# Patient Record
Sex: Female | Born: 1940 | Race: Black or African American | Hispanic: No | Marital: Married | Smoking: Former smoker
Health system: Southern US, Community
[De-identification: ages and names within clinical notes are randomized; demographics above are authoritative.]

## PROBLEM LIST (undated history)

## (undated) DIAGNOSIS — G589 Mononeuropathy, unspecified: Secondary | ICD-10-CM

## (undated) DIAGNOSIS — J302 Other seasonal allergic rhinitis: Secondary | ICD-10-CM

## (undated) DIAGNOSIS — I10 Essential (primary) hypertension: Secondary | ICD-10-CM

## (undated) DIAGNOSIS — E119 Type 2 diabetes mellitus without complications: Secondary | ICD-10-CM

## (undated) DIAGNOSIS — S149XXA Injury of unspecified nerves of neck, initial encounter: Secondary | ICD-10-CM

## (undated) HISTORY — DX: Type 2 diabetes mellitus without complications: E11.9

## (undated) HISTORY — DX: Essential (primary) hypertension: I10

## (undated) HISTORY — PX: COLONOSCOPY: SHX174

## (undated) HISTORY — PX: DILATION AND CURETTAGE OF UTERUS: SHX78

---

## 1998-05-14 ENCOUNTER — Other Ambulatory Visit: Admission: RE | Admit: 1998-05-14 | Discharge: 1998-05-14 | Payer: Self-pay | Admitting: Emergency Medicine

## 1998-09-09 ENCOUNTER — Emergency Department (HOSPITAL_COMMUNITY): Admission: EM | Admit: 1998-09-09 | Discharge: 1998-09-09 | Payer: Self-pay | Admitting: Internal Medicine

## 1998-09-09 ENCOUNTER — Encounter: Payer: Self-pay | Admitting: Internal Medicine

## 1999-03-17 ENCOUNTER — Encounter: Admission: RE | Admit: 1999-03-17 | Discharge: 1999-06-15 | Payer: Self-pay | Admitting: Emergency Medicine

## 1999-06-20 ENCOUNTER — Other Ambulatory Visit: Admission: RE | Admit: 1999-06-20 | Discharge: 1999-06-20 | Payer: Self-pay | Admitting: Emergency Medicine

## 2000-06-22 ENCOUNTER — Emergency Department (HOSPITAL_COMMUNITY): Admission: EM | Admit: 2000-06-22 | Discharge: 2000-06-22 | Payer: Self-pay | Admitting: Emergency Medicine

## 2000-07-13 ENCOUNTER — Encounter: Admission: RE | Admit: 2000-07-13 | Discharge: 2000-07-13 | Payer: Self-pay | Admitting: Family Medicine

## 2000-07-13 ENCOUNTER — Encounter: Payer: Self-pay | Admitting: Family Medicine

## 2000-09-20 ENCOUNTER — Ambulatory Visit (HOSPITAL_COMMUNITY): Admission: RE | Admit: 2000-09-20 | Discharge: 2000-09-20 | Payer: Self-pay | Admitting: Gastroenterology

## 2003-09-01 ENCOUNTER — Emergency Department (HOSPITAL_COMMUNITY): Admission: EM | Admit: 2003-09-01 | Discharge: 2003-09-01 | Payer: Self-pay | Admitting: *Deleted

## 2004-08-01 ENCOUNTER — Emergency Department (HOSPITAL_COMMUNITY): Admission: EM | Admit: 2004-08-01 | Discharge: 2004-08-01 | Payer: Self-pay | Admitting: Family Medicine

## 2004-10-12 ENCOUNTER — Emergency Department (HOSPITAL_COMMUNITY): Admission: EM | Admit: 2004-10-12 | Discharge: 2004-10-13 | Payer: Self-pay | Admitting: Emergency Medicine

## 2004-10-20 ENCOUNTER — Encounter: Admission: RE | Admit: 2004-10-20 | Discharge: 2004-10-20 | Payer: Self-pay | Admitting: Emergency Medicine

## 2007-09-30 ENCOUNTER — Other Ambulatory Visit: Admission: RE | Admit: 2007-09-30 | Discharge: 2007-09-30 | Payer: Self-pay | Admitting: Emergency Medicine

## 2011-01-23 NOTE — Op Note (Signed)
[place]. [facility]  Patient:    Barbara Rodriguez, Barbara Rodriguez                    MRN: [id number] Proc. Date: 09/20/00 Adm. Date:  [id number] Attending:  Louie Bun CC:         Joycelyn Rua, M.D.   Operative Report  PROCEDURE PERFORMED:  Colonoscopy.  ENDOSCOPIST:  Everardo All. Madilyn Fireman, M.D.  INDICATIONS FOR PROCEDURE: Heme positive stools in a 70 year old patient with no previous colon screening.  DESCRIPTION OF PROCEDURE:  The patient was placed in the left lateral decubitus position and placed on the pulse monitor with continuous low flow oxygen delivered by nasal cannula.  She was sedated with 70 mg IV Demerol and 7 mg of IV Versed.  The Olympus video colonoscope was inserted into the rectum and advanced to the cecum, confirmed by transillumination of McBurneys point and visualization of the ileocecal valve and appendiceal orifice.  The prep was adequate.  The cecum, ascending, transverse and descending colon all appeared normal with no masses, polyps, diverticula or other mucosal abnormalities.  In the sigmoid colon were seen a few scattered diverticula and no other abnormalities. The rectum appeared normal and retroflex view of the anus revealed no obvious internal hemorrhoids.  The colonoscope was then withdrawn and the patient returned to the recovery room in stable condition. The patient tolerated the procedure well and there were no immediate complications.  IMPRESSION:  Left-sided diverticulosis, otherwise normal colonoscopy. DD:  09/20/00 TD:  09/20/00 Job: [id number]

## 2011-02-16 ENCOUNTER — Other Ambulatory Visit: Payer: Self-pay | Admitting: Obstetrics and Gynecology

## 2011-02-16 ENCOUNTER — Ambulatory Visit (HOSPITAL_COMMUNITY)
Admission: AD | Admit: 2011-02-16 | Discharge: 2011-02-16 | Disposition: A | Discharge status: Home / Self Care | Payer: Medicare Other | Source: Ambulatory Visit | Attending: Obstetrics and Gynecology | Admitting: Obstetrics and Gynecology

## 2011-02-16 DIAGNOSIS — R9389 Abnormal findings on diagnostic imaging of other specified body structures: Secondary | ICD-10-CM | POA: Insufficient documentation

## 2011-02-16 DIAGNOSIS — N950 Postmenopausal bleeding: Secondary | ICD-10-CM | POA: Insufficient documentation

## 2011-02-16 LAB — BASIC METABOLIC PANEL
BUN: 10 mg/dL (ref 6–23)
CO2: 26 mEq/L (ref 19–32)
Chloride: 103 mEq/L (ref 96–112)
Creatinine, Ser: 0.85 mg/dL (ref 0.4–1.2)
GFR calc Af Amer: >60 mL/min (ref >60)
Potassium: 4 mEq/L (ref 3.5–5.1)

## 2011-02-16 LAB — CBC
HCT: 38 % (ref 36.0–46.0)
Hemoglobin: 12.2 g/dL (ref 12.0–15.0)
MCH: 25.6 pg — ABNORMAL LOW (ref 26.0–34.0)
MCHC: 32.1 g/dL (ref 30.0–36.0)
MCV: 79.8 fL (ref 78.0–100.0)
Platelets: 193 10*3/uL (ref 150–400)
RBC: 4.76 MIL/uL (ref 3.87–5.11)
RDW: 14.3 % (ref 11.5–15.5)
WBC: 4.8 10*3/uL (ref 4.0–10.5)

## 2011-02-19 NOTE — Op Note (Signed)
  NAMEMAYAN, DOLNEY             ACCOUNT NO.:  [id number]  MEDICAL RECORD NO.:  [id number]  LOCATION:  [facility]                          FACILITY:  [facility]  PHYSICIAN:  Patsy Baltimore, MD     DATE OF BIRTH:  1941/05/21  DATE OF PROCEDURE:  02/16/2011 DATE OF DISCHARGE:                              OPERATIVE REPORT   PREOPERATIVE DIAGNOSES:  Postmenopausal bleeding, thickened endometrial lining.  POSTOPERATIVE DIAGNOSIS:  Postmenopausal bleeding, thickened endometrial lining.  PROCEDURE PERFORMED:  Hysteroscopy and D and C.  SURGEON:  Patsy Baltimore, MD.  ANESTHESIA:  General.  FINDINGS:  Thin endometrium.  Specimen sent was endometrial curettings.  ESTIMATED BLOOD LOSS:  Minimal.  COMPLICATIONS:  None.  Ms. Jessic Standifer is a 71 year old para 1 who was seen for postmenopausal bleeding on an outpatient basis.  Her endometrial biopsy came back negative.  However, her ultrasound demonstrated an endometrial stripe measuring 7.7 mL with multiple tiny cystic areas noted within the endometrium with blood flow.  She also was noted to have uterine fibroids.  Informed consent was obtained from the patient for hysteroscopy and D and C to further investigate this thickened endometrial lining in her postmenopausal state.  The patient verbalized understanding of the risks, benefits of the procedure and on the day of surgery she was taken to the operating room with IV fluids running.  She was put under general anesthesia, her legs were lifted up to the dorsal lithotomy position.  She was prepped and draped in the usual sterile fashion.  Her bladder was drained with a red rubber catheter.  A time- out was called and we began the procedure.  A speculum was inserted into the vagina and a single-toothed tenaculum was used to grasp the anterior lip of the cervix.  The cervix was then dilated enough to accommodate the hysteroscope.  Initial inspection of the uterus revealed a thin- walled  endometrial cavity.  The distending medium used was 1.5% glycine. A circumferential inspection of the cavity was done and there was no evidence of uterine polyp for over a structural endometrial pathology. The instruments were then removed.  Scant amount of tissue was obtained with sharp curettage and sent off to Pathology.  The single-toothed tenaculum was then removed from the anterior lip of the cervix.  The puncture sites were hemostatic.  The speculum was removed.  The procedure was concluded.  The patient was awakened and taken to the PACU in stable condition.  She tolerated the procedure well.          ______________________________ Patsy Baltimore, MD     CO/MEDQ  D:  02/16/2011  T:  02/17/2011  Job:  [id number]  Electronically Signed by Patsy Baltimore MD on 02/19/2011 10:00:12 AM

## 2011-12-31 ENCOUNTER — Other Ambulatory Visit (HOSPITAL_COMMUNITY): Payer: Self-pay | Admitting: Family Medicine

## 2011-12-31 DIAGNOSIS — Z1231 Encounter for screening mammogram for malignant neoplasm of breast: Secondary | ICD-10-CM

## 2012-01-12 ENCOUNTER — Other Ambulatory Visit: Payer: Self-pay | Admitting: Gastroenterology

## 2012-01-26 ENCOUNTER — Ambulatory Visit (HOSPITAL_COMMUNITY)
Admission: RE | Admit: 2012-01-26 | Discharge: 2012-01-26 | Disposition: A | Discharge status: Home / Self Care | Payer: Medicare Other | Source: Ambulatory Visit | Attending: Family Medicine | Admitting: Family Medicine

## 2012-01-26 DIAGNOSIS — Z1231 Encounter for screening mammogram for malignant neoplasm of breast: Secondary | ICD-10-CM | POA: Insufficient documentation

## 2013-08-10 ENCOUNTER — Encounter: Payer: Medicare PPO | Attending: Family Medicine | Admitting: *Deleted

## 2013-08-10 ENCOUNTER — Encounter: Payer: Self-pay | Admitting: *Deleted

## 2013-08-10 VITALS — Ht 62.0 in | Wt 132.9 lb

## 2013-08-10 DIAGNOSIS — E119 Type 2 diabetes mellitus without complications: Secondary | ICD-10-CM | POA: Insufficient documentation

## 2013-08-10 DIAGNOSIS — Z713 Dietary counseling and surveillance: Secondary | ICD-10-CM | POA: Insufficient documentation

## 2013-08-10 NOTE — Progress Notes (Signed)
Appt start time: 1100 end time:  1230.  Assessment:  Patient was seen on  08/10/13 for individual diabetes education. She is caring for her 72 year old mother who is starting with dementia. Strong family history of diabetes, her goal is to control with food and exercise, limit medications as able. She states history of diabetes for about 30 years and has had Diabetes education at various centers in the [place] area over the years. She states she SMBG 2-3 times a day with reported range of 103-169 mg/dl currently. She enjoys walking for about 30 minutes twice daily.  Current HbA1c: 8.0%  Preferred Learning Style:   No preference indicated   Learning Readiness:   Ready  Change in progress  MEDICATIONS: see list, no diabetes medications at this time  DIETARY INTAKE:  24-hr recall:  B ( AM): regular oatmeal and banana or applesauce, coffee with Hazelnut flavored creamer  Snk ( AM): no  L ( PM): chicken salad on 1 slice bread, applesauce or [place] yogurt, water or fruit drink Snk ( PM): no D ( PM): beans often OR lean meat, in chili and some avocado,  water or fruit drink  Snk ( PM): peanut cluster Beverages: coffee,   Usual physical activity: she walks for 30 minutes twice every day after breakfast and before supper  Estimated energy needs: 1400 calories 158 g carbohydrates 105 g protein 39 g fat    Intervention:  Nutrition counseling and or written info provided.  Discussed diabetes disease process and treatment options.  Discussed physiology of diabetes and role of obesity on insulin resistance.  Encouraged moderate weight reduction to improve glucose levels.  Discussed role of medications and diet in glucose control  Provided education on macronutrients on glucose levels.  Provided education on carb counting, importance of regularly scheduled meals/snacks, and meal planning  Discussed effects of physical activity on glucose levels and long-term glucose control.  Commended  her on her150 minutes of physical activity/week.  Discussed blood glucose monitoring and interpretation.  Discussed recommended target ranges and individual ranges.    Described short-term complications: hyper- and hypo-glycemia.  Discussed causes,symptoms, and treatment options. Provided information on:  Prevention, detection, and treatment of long-term complications.  Discussed the role of prolonged elevated glucose levels on body systems.  Role of stress on blood glucose levels and discussed strategies to manage psychosocial issues.  Recommendations for long-term diabetes self-care.  Established checklist for medical, dental, and emotional self-care.  Plan:  Aim for 3 Carb Choices per meal (45 grams) +/- 1 either way  Aim for 0-1 Carbs per snack if hungry  Consider reading food labels for Total Carbohydrate of foods Continue with your activity level tolerated Continue checking BG at alternate times per day as directed by MD  Teaching Method Utilized: all: Visual Auditory Hands on  Handouts given during visit include: Living Well with Diabetes Carb Counting and Food Label handouts Meal Plan Card  Barriers to learning/adherence to lifestyle change: caring for elderly parent  Diabetes self-care support plan:   [facility] support group available  Demonstrated degree of understanding via:  Teach Back   Monitoring/Evaluation:  Dietary intake, exercise, reading food labels, and body weight in 6 weeks.

## 2013-08-10 NOTE — Patient Instructions (Signed)
Plan:  Aim for 3 Carb Choices per meal (45 grams) +/- 1 either way  Aim for 0-1 Carbs per snack if hungry  Consider reading food labels for Total Carbohydrate of foods Continue with your activity level tolerated Continue checking BG at alternate times per day as directed by MD

## 2013-08-18 ENCOUNTER — Encounter: Payer: Self-pay | Admitting: *Deleted

## 2013-09-21 ENCOUNTER — Encounter: Payer: Self-pay | Admitting: *Deleted

## 2013-09-21 ENCOUNTER — Encounter: Payer: Medicare PPO | Attending: Family Medicine | Admitting: *Deleted

## 2013-09-21 VITALS — Ht 62.0 in | Wt 132.7 lb

## 2013-09-21 DIAGNOSIS — Z713 Dietary counseling and surveillance: Secondary | ICD-10-CM | POA: Insufficient documentation

## 2013-09-21 DIAGNOSIS — E119 Type 2 diabetes mellitus without complications: Secondary | ICD-10-CM

## 2013-09-21 NOTE — Progress Notes (Signed)
Appt start time: 1530 end time:  1600.  Assessment:  Patient was seen on  09/21/13 for individual diabetes education follow up visit. Weight is stable since last visit @ 132.7 lb.She continues to care for her 73 year old mother who is starting with dementia. She states she has had a sinus infection since last visit and has not been able to exercise normally, but plans to resume as soon as possible.  Current HbA1c: 8.0%  Preferred Learning Style:   No preference indicated   Learning Readiness:   Ready  Change in progress  MEDICATIONS: see list, no diabetes medications at this time  DIETARY INTAKE:  24-hr recall:  B ( AM): regular oatmeal and banana or applesauce, coffee with Hazelnut flavored creamer  Snk ( AM): no  L ( PM): chicken salad on 1 slice bread, applesauce or [place] yogurt, water or fruit drink Snk ( PM): no D ( PM): beans often OR lean meat, in chili and some avocado,  water or fruit drink  Snk ( PM): peanut cluster Beverages: coffee,   Usual physical activity: she walks for 30 minutes twice every day after breakfast and before supper  Estimated energy needs: 1400 calories 158 g carbohydrates 105 g protein 39 g fat     Intervention:  Commended her on her continued good eating habits and reviewed her Log Sheets she brought to the appointment which indicate FBG range of 118 - 170 mg/dl and other meals running better at 101-125 mg/dl. Reviewed the reading of food labels today.  Plan:  Aim for 3 Carb Choices per meal (45 grams) +/- 1 either way  Aim for 0-1 Carbs per snack if hungry  Consider reading food labels for Total Carbohydrate of foods Continue with your activity level tolerated Continue checking BG at alternate times per day as directed by MD  Teaching Method Utilized: all: Visual Auditory Hands on  Handouts given during visit include: Carb Counting and Food Label handouts  Barriers to learning/adherence to lifestyle change: caring for elderly  parent  Diabetes self-care support plan:   [facility] support group available  Demonstrated degree of understanding via:  Teach Back   Monitoring/Evaluation:  Dietary intake, exercise, reading food labels, and body weight in 2 months.

## 2013-11-09 ENCOUNTER — Ambulatory Visit: Payer: 59 | Admitting: *Deleted

## 2014-01-11 ENCOUNTER — Encounter: Payer: Self-pay | Admitting: *Deleted

## 2014-01-11 ENCOUNTER — Encounter: Payer: Medicare PPO | Attending: Family Medicine | Admitting: *Deleted

## 2014-01-11 VITALS — Ht 62.0 in | Wt 134.5 lb

## 2014-01-11 DIAGNOSIS — E119 Type 2 diabetes mellitus without complications: Secondary | ICD-10-CM | POA: Insufficient documentation

## 2014-01-11 DIAGNOSIS — Z713 Dietary counseling and surveillance: Secondary | ICD-10-CM | POA: Insufficient documentation

## 2014-01-11 NOTE — Progress Notes (Signed)
Appt start time: 1530 end time:  1600.  Assessment:  Patient was seen on  01/11/14 for individual diabetes education follow up visit. She brought her log books, she is averaging 150 mg/dl in AM and under 578130 mg/dl before lunch. She is still not on any diabetes medications yet. Weight is relatively stable since last visit @ 134.5 lb.She continues to care for her 73 year old mother who is starting with dementia. She continues to walk 30 minutes after breakfast and after supper inside her house or [facility]  She states she has had a sinus infection since last visit and has not been able to exercise normally, but plans to resume as soon as possible.  Current HbA1c: 8.0%  Preferred Learning Style:   No preference indicated   Learning Readiness:   Ready  Change in progress  MEDICATIONS: see list, no diabetes medications at this time  DIETARY INTAKE:  24-hr recall:  B ( AM): regular oatmeal and banana or applesauce, coffee with Hazelnut flavored creamer  Snk ( AM): no  L ( PM): chicken salad on 1 slice bread, applesauce or [place] yogurt, water or fruit drink Snk ( PM): no D ( PM): beans often OR lean meat, in chili and some avocado,  water or fruit drink  Snk ( PM): peanut cluster Beverages: coffee,   Usual physical activity: she walks for 30 minutes twice every day after breakfast and before supper  Estimated energy needs: 1400 calories 158 g carbohydrates 105 g protein 39 g fat     Intervention:  Commended her on her continued good eating habits and reviewed her Log Sheets she brought to the appointment which indicate FBG range of 118 - 170 mg/dl and other meals running better at 101-125 mg/dl. Reviewed the reading of food labels today.  Plan:  Aim for 3 Carb Choices per meal (45 grams) +/- 1 either way  Aim for 0-1 Carbs per snack if hungry  Continue reading food labels for Total Carbohydrate of foods Continue with your activity level walking around your house as  tolerated Continue checking BG at alternate times per day as directed by MD  You're doing a great job!   Teaching Method Utilized: all: Visual Auditory Hands on  Handouts given during visit include: Carb Counting and Food Label handouts  Barriers to learning/adherence to lifestyle change: caring for elderly parent  Diabetes self-care support plan:   [facility] support group available  Demonstrated degree of understanding via:  Teach Back   Monitoring/Evaluation:  Dietary intake, exercise, reading food labels, and body weight in 2 months.

## 2014-01-11 NOTE — Patient Instructions (Signed)
Plan:  Aim for 3 Carb Choices per meal (45 grams) +/- 1 either way  Aim for 0-1 Carbs per snack if hungry  Continue reading food labels for Total Carbohydrate of foods Continue with your activity level walking around your house as tolerated Continue checking BG at alternate times per day as directed by MD  You're doing a great job!

## 2014-03-13 ENCOUNTER — Encounter: Payer: Medicare PPO | Attending: Family Medicine | Admitting: *Deleted

## 2014-03-13 ENCOUNTER — Encounter: Payer: Self-pay | Admitting: *Deleted

## 2014-03-13 DIAGNOSIS — E119 Type 2 diabetes mellitus without complications: Secondary | ICD-10-CM | POA: Diagnosis not present

## 2014-03-13 DIAGNOSIS — Z713 Dietary counseling and surveillance: Secondary | ICD-10-CM | POA: Diagnosis not present

## 2014-03-13 NOTE — Progress Notes (Signed)
Appt start time: 1530 end time:  1600.  Assessment:  Patient was seen on  03/13/14 for individual diabetes education follow up visit. She brought her log books again, she is now averaging 130 mg/dl in AM and under 161130 mg/dl before lunch. She states she has a problem limiting her chips in the evenings and has not been able to get to [employer] where she prefers to walk each day due to having to care for her Mother, who is now in Hospice Care at home. Weight is still stable since last visit @ 134.5 lb.S  Current HbA1c: 8.0%  Preferred Learning Style:   No preference indicated   Learning Readiness:   Ready  Change in progress  MEDICATIONS: see list, no diabetes medications at this time  DIETARY INTAKE:  24-hr recall:  B ( AM): regular oatmeal and banana or applesauce, coffee with Hazelnut flavored creamer  Snk ( AM): no  L ( PM): chicken salad on 1 slice bread, applesauce or [place] yogurt, water or fruit drink Snk ( PM): no D ( PM): beans often OR lean meat, in chili and some avocado,  water or fruit drink  Snk ( PM): peanut cluster Beverages: coffee,   Usual physical activity: she walks for 30 minutes twice every day after breakfast and before supper  Estimated energy needs: 1400 calories 158 g carbohydrates 105 g protein 39 g fat     Intervention:  Reviewed her Log Sheets she brought to the appointment which indicate FBG range of 118 - 170 mg/dl and other meals running better at 101-125 mg/dl. Suggested the use of Arm Chair Exercises as back up plan for walking when she can't leave her home. Also suggested a 2 hour post meal BG occasionally to capture how high her BGs can go, which will affect her A1c too.   Plan:  Continue to aim for 3 Carb Choices per meal (45 grams) +/- 1 either way  Continue to aim for 0-1 Carbs per snack if hungry  Continue reading food labels for Total Carbohydrate of foods Continue with your activity level walking around your house as tolerated Consider  the Arm Chair Exercises at home when you [facility]'t walk [facility] Continue checking BG at alternate times per day, including 2 hours after a meal occasionally  You're doing a great job!   Teaching Method Utilized: Auditory  Handouts given during visit include: Arm Chair Exercise Handout and suggestion for videos on computer  Barriers to learning/adherence to lifestyle change: caring for elderly parent  Diabetes self-care support plan:   [facility] support group available  Demonstrated degree of understanding via:  Teach Back   Monitoring/Evaluation:  Dietary intake, exercise, reading food labels, and body weight in 2 months.

## 2014-03-13 NOTE — Patient Instructions (Addendum)
Plan:  Continue to aim for 3 Carb Choices per meal (45 grams) +/- 1 either way  Continue to aim for 0-1 Carbs per snack if hungry  Continue reading food labels for Total Carbohydrate of foods Continue with your activity level walking around your house as tolerated Consider the Arm Chair Exercises at home when [facility] Continue checking BG at alternate times per day, including 2 hours after a meal occasionally  You're doing a great job!

## 2014-05-16 ENCOUNTER — Encounter: Payer: Self-pay | Admitting: *Deleted

## 2014-05-16 ENCOUNTER — Encounter: Payer: Medicare PPO | Attending: Family Medicine | Admitting: *Deleted

## 2014-05-16 VITALS — Ht 62.0 in | Wt 132.5 lb

## 2014-05-16 DIAGNOSIS — E119 Type 2 diabetes mellitus without complications: Secondary | ICD-10-CM | POA: Diagnosis not present

## 2014-05-16 DIAGNOSIS — Z713 Dietary counseling and surveillance: Secondary | ICD-10-CM | POA: Insufficient documentation

## 2014-05-16 NOTE — Progress Notes (Signed)
Appt start time: 1530 end time:  1600.  Assessment:  Patient was seen on 05/16/14 for individual diabetes education follow up visit. She brought her log books again, she continues averaging 150 mg/dl in AM and under 161 mg/dl before lunch. She states she had some company over Labor Day and ate some extra foods while they were here. So she has gotten back on track with her food and is happy with weight loss of 2 pounds this past month. She is also turning off her air conditioner with the cooler weather and sometimes she breaks out in a sweat when the weather warms up during the day. She continues walking in the evenings and has been able to get to [employer] where she prefers to walk each day due to having to care for her Mother, who is now in Hospice Care at home.   Current HbA1c: 8.0%, next appointment for A1c is in December  Preferred Learning Style:   No preference indicated   Learning Readiness:   Ready  Change in progress  MEDICATIONS: see list, no diabetes medications at this time  DIETARY INTAKE:  24-hr recall:  B ( AM): regular oatmeal and banana or applesauce, coffee with Hazelnut flavored creamer  Snk ( AM): no  L ( PM): chicken salad on 1 slice bread, applesauce or [place] yogurt, water or fruit drink Snk ( PM): no D ( PM): beans often OR lean meat, in chili and some avocado,  water or fruit drink  Snk ( PM): peanut cluster or a handful of chips Beverages: coffee,   Usual physical activity: she walks for 30 minutes twice every day after breakfast and before supper  Estimated energy needs: 1400 calories 158 g carbohydrates 105 g protein 39 g fat     Intervention:  Reviewed her Log Sheets she brought to the appointment which indicate FBG range of 118 - 160 mg/dl and other meals running better at 101-125 mg/dl. Reminded her of the use of Arm Chair Exercises as back up plan for walking when she can't leave her home. Commended her on her weight loss of 2 pounds this visit.    Plan:  Continue to aim for 3 Carb Choices per meal (45 grams) +/- 1 either way  Continue to aim for 0-1 Carbs per snack if hungry  Continue reading food labels for Total Carbohydrate of foods Continue with your activity level walking around your house as tolerated Consider the Arm Chair Exercises at home when you can't walk at [facility] Continue checking BG at alternate times per day, including 2 hours after a meal occasionally  You're doing a great job!   Teaching Method Utilized: Auditory  Handouts given during visit include: No handouts this visit. Gave her a blue star to acknowledge the progress she has made  Barriers to learning/adherence to lifestyle change: caring for elderly parent  Diabetes self-care support plan:   [facility] support group available  Demonstrated degree of understanding via:  Teach Back   Monitoring/Evaluation:  Dietary intake, exercise, reading food labels, and body weight in 2 months.

## 2014-05-16 NOTE — Patient Instructions (Signed)
Plan:  Continue to aim for 3 Carb Choices per meal (45 grams) +/- 1 either way  Continue to aim for 0-1 Carbs per snack if hungry  Continue reading food labels for Total Carbohydrate of foods Continue with your activity level walking around your house as tolerated Consider the Arm Chair Exercises at home when you can't walk at [employer] Continue checking BG at alternate times per day, including 2 hours after a meal occasionally  You're doing a great job!    

## 2014-07-26 ENCOUNTER — Ambulatory Visit: Payer: 59 | Admitting: *Deleted

## 2014-09-06 ENCOUNTER — Ambulatory Visit: Payer: 59 | Admitting: *Deleted

## 2014-09-21 ENCOUNTER — Encounter: Payer: Medicare PPO | Attending: Family Medicine | Admitting: *Deleted

## 2014-09-21 VITALS — Ht 62.0 in | Wt 128.3 lb

## 2014-09-21 DIAGNOSIS — E119 Type 2 diabetes mellitus without complications: Secondary | ICD-10-CM | POA: Insufficient documentation

## 2014-09-21 NOTE — Progress Notes (Signed)
Appt start time: 1100 end time:  1130.  Assessment:  Patient was seen on 09/21/14 for individual diabetes education follow up visit. She is happy with weight loss of 4 pounds since last visit in September. She states she had to miss a couple of appointments due to sinus problems. She does not have her log sheets today, but she states her BG continues to be within the target ranges pre and post meal unless she is sick, they will go higher. She continues to care for her mother daily, so that stress is still there.   Current HbA1c: 7.6% in December  Preferred Learning Style:   No preference indicated   Learning Readiness:   Ready  Change in progress  MEDICATIONS: see list, no diabetes medications at this time  DIETARY INTAKE:  24-hr recall:  B ( AM): regular oatmeal and banana or applesauce, coffee with Hazelnut flavored creamer  Snk ( AM): no  L ( PM): chicken salad on 1 slice bread, applesauce or [place] yogurt, water or fruit drink Snk ( PM): no D ( PM): beans often OR lean meat, in chili and some avocado,  water or fruit drink  Snk ( PM): peanut cluster or a handful of chips Beverages: coffee,   Usual physical activity: she walks for 30 minutes twice every day after breakfast and before supper  Estimated energy needs: 1400 calories 158 g carbohydrates 105 g protein 39 g fat     Intervention:  Commended her on her continued good eating habits and exercise, resulting in her current weight loss. I suggested she maintain a weight between 125 and 130 pounds for good health. Explained that continued weight loss or excessive exercise may not be adequate as time goes on to control her BG, and that she may need diabetes medication eventually through no fault of her own.    Plan:  Continue to aim for 3 Carb Choices per meal (45 grams) +/- 1 either way  Continue to aim for 0-1 Carbs per snack if hungry  Continue reading food labels for Total Carbohydrate of foods Continue with your  activity level walking around your house as tolerated Consider the Arm Chair Exercises at home when [facility] Continue checking BG at alternate times per day, including 2 hours after a meal occasionally  You're doing a great job!   Teaching Method Utilized: Auditory  Handouts given during visit include: No handouts this visit. Gave her a red star to acknowledge the progress she has made  Barriers to learning/adherence to lifestyle change: caring for elderly parent  Diabetes self-care support plan:   [facility] support group available  Demonstrated degree of understanding via:  Teach Back   Monitoring/Evaluation:  Dietary intake, exercise, reading food labels, and body weight in 1 month.

## 2014-09-21 NOTE — Patient Instructions (Signed)
Plan:  Continue to aim for 3 Carb Choices per meal (45 grams) +/- 1 either way  Continue to aim for 0-1 Carbs per snack if hungry  Continue reading food labels for Total Carbohydrate of foods Continue with your activity level walking around your house as tolerated Consider the Arm Chair Exercises at home when you can't walk at [employer] Continue checking BG at alternate times per day, including 2 hours after a meal occasionally  You're doing a great job!    

## 2014-09-25 DIAGNOSIS — H04123 Dry eye syndrome of bilateral lacrimal glands: Secondary | ICD-10-CM | POA: Diagnosis not present

## 2014-09-25 DIAGNOSIS — H1859 Other hereditary corneal dystrophies: Secondary | ICD-10-CM | POA: Diagnosis not present

## 2014-09-25 DIAGNOSIS — H40013 Open angle with borderline findings, low risk, bilateral: Secondary | ICD-10-CM | POA: Diagnosis not present

## 2014-09-25 DIAGNOSIS — H2513 Age-related nuclear cataract, bilateral: Secondary | ICD-10-CM | POA: Diagnosis not present

## 2014-10-01 DIAGNOSIS — H40013 Open angle with borderline findings, low risk, bilateral: Secondary | ICD-10-CM | POA: Diagnosis not present

## 2014-10-26 ENCOUNTER — Ambulatory Visit: Payer: 59 | Admitting: *Deleted

## 2014-11-01 ENCOUNTER — Encounter: Payer: Medicare PPO | Attending: Family Medicine | Admitting: *Deleted

## 2014-11-01 ENCOUNTER — Encounter: Payer: Self-pay | Admitting: *Deleted

## 2014-11-01 VITALS — Ht 62.0 in | Wt 128.8 lb

## 2014-11-01 DIAGNOSIS — E119 Type 2 diabetes mellitus without complications: Secondary | ICD-10-CM

## 2014-11-01 NOTE — Patient Instructions (Signed)
Plan:  Continue to aim for 3 Carb Choices per meal (45 grams) +/- 1 either way  Continue to aim for 0-1 Carbs per snack if hungry  Continue reading food labels for Total Carbohydrate of foods Continue with your activity level walking around your house as tolerated Consider the Arm Chair Exercises at home when you can't walk at [employer] Continue checking BG at alternate times per day, including 2 hours after a meal occasionally  You're doing a great job!    

## 2014-11-01 NOTE — Progress Notes (Signed)
Appt start time: 1000 end time:  1030.  Assessment:  Patient was seen on 09/21/14 for individual diabetes education follow up visit. Weight is stable @ 128.8 pounds since last visit. She brought her log sheets today,her BG continues to be within the target ranges pre and post meal. She continues to care for her mother daily, but she states [place] so she can get out to run errands and go to church which is very helpful.   Current HbA1c: 7.6% in December  Preferred Learning Style:   No preference indicated   Learning Readiness:   Ready  Change in progress  MEDICATIONS: see list, no diabetes medications at this time  DIETARY INTAKE:  24-hr recall:  B ( AM): regular oatmeal and banana or applesauce, coffee with Hazelnut flavored creamer  Snk ( AM): no  L ( PM): chicken salad on 1 slice bread, applesauce or [place] yogurt, water or fruit drink Snk ( PM): no D ( PM): beans often OR lean meat, in chili and some avocado,  water or fruit drink  Snk ( PM): peanut cluster or a handful of chips Beverages: coffee,   Usual physical activity: she walks for 30 minutes twice every day after breakfast and before supper  Estimated energy needs: 1400 calories 158 g carbohydrates 105 g protein 39 g fat     Intervention:  Commended her on her continued good eating habits and exercise, resulting in good BG control. I reminded her that she should maintain a weight between 125 and 130 pounds for good health.     Plan:  Continue to aim for 3 Carb Choices per meal (45 grams) +/- 1 either way  Continue to aim for 0-1 Carbs per snack if hungry  Continue reading food labels for Total Carbohydrate of foods Continue with your activity level walking around your house as tolerated Consider the Arm Chair Exercises at home when you can't walk [facility] Continue checking BG at alternate times per day, including 2 hours after a meal occasionally  You're doing a great job!    Teaching Method Utilized: Auditory  Handouts given during visit include: No handouts this visit. Gave her a red star to acknowledge the progress she has made  Barriers to learning/adherence to lifestyle change: caring for elderly parent  Diabetes self-care support plan:   [facility]  Demonstrated degree of understanding via:  Teach Back   Monitoring/Evaluation:  Dietary intake, exercise, reading food labels, and body weight in 1 month.

## 2014-12-06 ENCOUNTER — Ambulatory Visit: Payer: BC Managed Care – PPO | Admitting: *Deleted

## 2014-12-14 ENCOUNTER — Ambulatory Visit: Payer: BC Managed Care – PPO | Admitting: *Deleted

## 2015-01-03 ENCOUNTER — Ambulatory Visit: Payer: BC Managed Care – PPO | Admitting: *Deleted

## 2015-01-18 ENCOUNTER — Encounter: Payer: Medicare PPO | Attending: Family Medicine | Admitting: *Deleted

## 2015-01-18 ENCOUNTER — Encounter: Payer: Self-pay | Admitting: *Deleted

## 2015-01-18 VITALS — Ht 62.0 in | Wt 132.8 lb

## 2015-01-18 DIAGNOSIS — E119 Type 2 diabetes mellitus without complications: Secondary | ICD-10-CM | POA: Insufficient documentation

## 2015-01-18 NOTE — Progress Notes (Signed)
Appt start time: 1000 end time:  1030.  Assessment:  Patient was seen on 01/1315 for individual diabetes education follow up visit. She states she has been sick with her allergies sometimes with fever so less activity lately. Still caring for her mother who she states is doing better lately. They celebrated her Mothers's 97th birthday with her 74 yo sister coming to celebrate with them. She continues to check her BG each AM with range 120-160 with average below 140 mg/dl. She see's her MD every 6 months so the next A1c won't be until June, 2016.   Current HbA1c: 7.6% in December  Preferred Learning Style:   No preference indicated   Learning Readiness:   Ready  Change in progress  MEDICATIONS: see list, no diabetes medications at this time  DIETARY INTAKE:  24-hr recall:  B ( AM): regular oatmeal and banana or applesauce, coffee with Hazelnut flavored creamer  Snk ( AM): no  L ( PM): chicken salad on 1 slice bread, applesauce or [place] yogurt, water or fruit drink Snk ( PM): no D ( PM): beans often OR lean meat, in chili and some avocado,  water or fruit drink  Snk ( PM): peanut cluster or a handful of chips Beverages: coffee,   Usual physical activity: she walks for 30 minutes twice every day after breakfast and before supper  Estimated energy needs: 1200 calories 135 g carbohydrates 90 g protein 33 g fat     Intervention:  Commended her again  on her continued good eating habits and exercise, resulting in good BG control. Per actual food intake she is averaging closer to 1200 calories per day and only 2 Carb Choices per meal, so meal plan adjusted. We have discussed that she should maintain a weight between 125 and 130 pounds for good health.     Plan:  Continue to aim for 2 Carb Choices per meal (30 grams) +/- 1 either way  Continue to aim for 0-1 Carbs per snack if hungry  Continue reading food labels for Total Carbohydrate of foods Continue with your activity level  walking around your house as tolerated Continue the Arm Chair Exercises at home when you can't walk [facility] Continue checking BG at alternate times per day, including 2 hours after a meal occasionally  You're doing a great job!   Teaching Method Utilized: Auditory  Handouts given during visit include: Carb Counting handout with examples of carb content of simple desserts. Gave her a green star to acknowledge the progress she has made  Barriers to learning/adherence to lifestyle change: caring for elderly parent  Diabetes self-care support plan:   [facility] support [facility] available  Demonstrated degree of understanding via:  Teach Back   Monitoring/Evaluation:  Dietary intake, exercise, reading food labels, and body weight in 1 month.

## 2015-01-18 NOTE — Patient Instructions (Signed)
Plan:  Continue to aim for 2 Carb Choices per meal (30 grams) +/- 1 either way  Continue to aim for 0-1 Carbs per snack if hungry  Continue reading food labels for Total Carbohydrate of foods Continue with your activity level walking around your house as tolerated Continue the Arm Chair Exercises at home when you can't walk at [employer] Continue checking BG at alternate times per day, including 2 hours after a meal occasionally  You're doing a great job!    

## 2015-01-22 ENCOUNTER — Other Ambulatory Visit (HOSPITAL_COMMUNITY): Payer: Self-pay | Admitting: Family Medicine

## 2015-01-22 DIAGNOSIS — Z1231 Encounter for screening mammogram for malignant neoplasm of breast: Secondary | ICD-10-CM

## 2015-01-24 ENCOUNTER — Ambulatory Visit (HOSPITAL_COMMUNITY)
Admission: RE | Admit: 2015-01-24 | Discharge: 2015-01-24 | Disposition: A | Discharge status: Home / Self Care | Payer: Medicare PPO | Source: Ambulatory Visit | Attending: Family Medicine | Admitting: Family Medicine

## 2015-01-24 DIAGNOSIS — Z1231 Encounter for screening mammogram for malignant neoplasm of breast: Secondary | ICD-10-CM | POA: Insufficient documentation

## 2015-02-21 ENCOUNTER — Ambulatory Visit: Payer: BC Managed Care – PPO | Admitting: *Deleted

## 2015-02-26 DIAGNOSIS — E780 Pure hypercholesterolemia: Secondary | ICD-10-CM | POA: Diagnosis not present

## 2015-02-26 DIAGNOSIS — D692 Other nonthrombocytopenic purpura: Secondary | ICD-10-CM | POA: Diagnosis not present

## 2015-02-26 DIAGNOSIS — Z23 Encounter for immunization: Secondary | ICD-10-CM | POA: Diagnosis not present

## 2015-02-26 DIAGNOSIS — Z0000 Encounter for general adult medical examination without abnormal findings: Secondary | ICD-10-CM | POA: Diagnosis not present

## 2015-02-26 DIAGNOSIS — E559 Vitamin D deficiency, unspecified: Secondary | ICD-10-CM | POA: Diagnosis not present

## 2015-02-26 DIAGNOSIS — Z79899 Other long term (current) drug therapy: Secondary | ICD-10-CM | POA: Diagnosis not present

## 2015-02-26 DIAGNOSIS — N182 Chronic kidney disease, stage 2 (mild): Secondary | ICD-10-CM | POA: Diagnosis not present

## 2015-02-26 DIAGNOSIS — E1165 Type 2 diabetes mellitus with hyperglycemia: Secondary | ICD-10-CM | POA: Diagnosis not present

## 2015-02-26 DIAGNOSIS — E1121 Type 2 diabetes mellitus with diabetic nephropathy: Secondary | ICD-10-CM | POA: Diagnosis not present

## 2015-02-26 DIAGNOSIS — I10 Essential (primary) hypertension: Secondary | ICD-10-CM | POA: Diagnosis not present

## 2015-03-14 ENCOUNTER — Encounter: Payer: Medicare PPO | Attending: Family Medicine | Admitting: *Deleted

## 2015-03-14 VITALS — Ht 62.0 in | Wt 130.0 lb

## 2015-03-14 DIAGNOSIS — E119 Type 2 diabetes mellitus without complications: Secondary | ICD-10-CM | POA: Insufficient documentation

## 2015-03-14 NOTE — Patient Instructions (Signed)
Plan:  Continue to aim for 2 Carb Choices per meal (30 grams) +/- 1 either way  Continue to aim for 0-1 Carbs per snack if hungry  Continue reading food labels for Total Carbohydrate of foods Continue with your activity level walking around your house as tolerated Continue the Arm Chair Exercises at home when you can't walk at [facility] Continue checking BG at alternate times per day, including 2 hours after a meal occasionally  You're doing a great job!

## 2015-03-15 NOTE — Progress Notes (Signed)
Appt start time: 1130 end time:  1200.  Assessment:  Patient was seen on 03/14/1615 for individual diabetes education follow up visit. She is feeling better today, happy with stable weight at 130 pounds. She did not bring her BG Log sheet today but states her last A1c was 7.7%. She is struggling with a Vitamin D deficiency.  Current HbA1c: 7.7% in June  Preferred Learning Style:   No preference indicated   Learning Readiness:   Ready  Change in progress  MEDICATIONS: see list, no diabetes medications at this time  DIETARY INTAKE:  24-hr recall:  B ( AM): regular oatmeal and banana or applesauce, coffee with Hazelnut flavored creamer  Snk ( AM): no  L ( PM): chicken salad on 1 slice bread, applesauce or [place] yogurt, water or fruit drink Snk ( PM): no D ( PM): beans often OR lean meat, in chili and some avocado,  water or fruit drink  Snk ( PM): peanut cluster or a handful of chips Beverages: coffee,   Usual physical activity: she walks for 30 minutes twice every day after breakfast and before supper  Estimated energy needs: 1200 calories 135 g carbohydrates 90 g protein 33 g fat    Intervention:  Commended her again  on her continued good eating habits and exercise, resulting in good BG control. I provided her with more information on Vitamin D content of foods per her request. Also reviewed Carb Counting and provided the yellow Meal Plan Card again.  Plan:  Continue to aim for 2 Carb Choices per meal (30 grams) +/- 1 either way  Continue to aim for 0-1 Carbs per snack if hungry  Continue reading food labels for Total Carbohydrate of foods Continue with your activity level walking around your house as tolerated Continue the Arm Chair Exercises at home when you can't walk [facility] Continue checking BG at alternate times per day, including 2 hours after a meal occasionally  You're doing a great job!    Teaching Method Utilized: Auditory  Handouts given during visit  include: Vitamin D content of foods list Yellow Meal Plan Card  Barriers to learning/adherence to lifestyle change: caring for elderly parent  Diabetes self-care support plan:   [facility] support [facility] available  Demonstrated degree of understanding via:  Teach Back   Monitoring/Evaluation:  Dietary intake, exercise, reading food labels, and body weight in 1 month.

## 2015-03-26 DIAGNOSIS — E2839 Other primary ovarian failure: Secondary | ICD-10-CM | POA: Diagnosis not present

## 2015-04-02 DIAGNOSIS — H40053 Ocular hypertension, bilateral: Secondary | ICD-10-CM | POA: Diagnosis not present

## 2015-04-18 ENCOUNTER — Ambulatory Visit: Payer: Medicare Other | Admitting: *Deleted

## 2015-05-01 ENCOUNTER — Encounter: Payer: Self-pay | Admitting: *Deleted

## 2015-05-01 ENCOUNTER — Encounter: Payer: Medicare PPO | Attending: Family Medicine | Admitting: *Deleted

## 2015-05-01 VITALS — Ht 62.0 in | Wt 132.5 lb

## 2015-05-01 DIAGNOSIS — E119 Type 2 diabetes mellitus without complications: Secondary | ICD-10-CM | POA: Diagnosis not present

## 2015-05-01 NOTE — Patient Instructions (Addendum)
Plan:  Continue to aim for 2 Carb Choices per meal (30 grams) +/- 1 either way  Continue to aim for 0-1 Carbs per snack if hungry  Continue reading food labels for Total Carbohydrate of foods Continue with your activity level walking around your house as tolerated Continue the Arm Chair Exercises at home when you can't walk at [employer] Continue checking BG at alternate times per day, including 2 hours after a meal occasionally  You're doing a great job! Consider checking your BG after a meal occasionally to capture that BG information    

## 2015-05-01 NOTE — Progress Notes (Signed)
Appt start time: 1130 end time:  1200.  Assessment:  Patient was seen on 05/01/2015 for individual diabetes education follow up visit. Weight up 2 pounds today @ 132.5 lbs. She states she continues to check her BG daily with reported range of 123-140 mg/dl. She does have some higher calorie and carb containing food choices she eats in form of comfort such as a Frosty from [employer]. She states that frequency has increased lately.   Current HbA1c: 7.7% in June  Preferred Learning Style:   No preference indicated   Learning Readiness:   Ready  Change in progress  MEDICATIONS: see list, no diabetes medications at this time  DIETARY INTAKE:  24-hr recall:  B ( AM): regular oatmeal and banana or applesauce, coffee with Hazelnut flavored creamer  Snk ( AM): no  L ( PM): chicken salad on 1 slice bread, applesauce or [place] yogurt, water or fruit drink Snk ( PM): no D ( PM): beans often OR lean meat, in chili and some avocado,  water or fruit drink  Snk ( PM): peanut cluster or a handful of chips Beverages: coffee,   Usual physical activity: she walks for 30 minutes twice every day after breakfast and before supper. She typically walks at [facility] to be in a safe place.  Estimated energy needs: 1200 calories 135 g carbohydrates 90 g protein 33 g fat    Intervention:  She continues to care for her mother so has little time for herself. I cautioned her about the increase in her A1c and suggested that if that continues, the MD will recommend starting a Diabetes medication. Explained that if she is eating appropriately and needs the medication, that is one thing, but if she is consuming too much food, then she may want to consider the frequency of the sweets and modify to help her body control her BG better. Commended her again  on her efforts toward continued good eating habits most of the time and her exercise, resulting in good BG control.  Plan:  Continue to aim for 2  Carb Choices per meal (30 grams) +/- 1 either way  Continue to aim for 0-1 Carbs per snack if hungry  Continue reading food labels for Total Carbohydrate of foods Continue with your activity level walking around your house as tolerated Continue the Arm Chair Exercises at home when you can't walk at [facility] Consider checking your BG after a meal occasionally to capture that BG information Continue checking BG at alternate times per day, including 2 hours after a meal occasionally  You're doing a great job!   Teaching Method Utilized: Auditory  Handouts given during visit include: No new handouts today  Barriers to learning/adherence to lifestyle change: caring for elderly parent  Diabetes self-care support plan:   [facility] support group available  Demonstrated degree of understanding via:  Teach Back   Monitoring/Evaluation:  Dietary intake, exercise, reading food labels, and body weight in 1 month.

## 2015-05-27 DIAGNOSIS — E559 Vitamin D deficiency, unspecified: Secondary | ICD-10-CM | POA: Diagnosis not present

## 2015-06-06 ENCOUNTER — Encounter: Payer: Medicare PPO | Attending: Family Medicine | Admitting: *Deleted

## 2015-06-06 ENCOUNTER — Encounter: Payer: Self-pay | Admitting: *Deleted

## 2015-06-06 VITALS — Ht 62.0 in | Wt 132.1 lb

## 2015-06-06 DIAGNOSIS — E119 Type 2 diabetes mellitus without complications: Secondary | ICD-10-CM | POA: Diagnosis not present

## 2015-06-06 NOTE — Progress Notes (Signed)
Appt start time: 1130 end time:  1200.  Assessment:  Patient was seen on 06/06/2015 for individual diabetes education follow up visit. Weight down 0.5 pounds today @ 131.1 lbs. She did not bring her Log Sheet today but she states she continues to check her BG daily with reported results within target ranges pre and post meals.  She continues to care for her elderly mother. She states when she does eat something higher in carb she makes every effort to walk more after that meal to help offset the potential affect on her BG.  Current HbA1c: 7.7% in June 2016  Preferred Learning Style:   No preference indicated   Learning Readiness:   Ready  Change in progress  MEDICATIONS: see list, no diabetes medications at this time  DIETARY INTAKE:  24-hr recall:  B ( AM): regular oatmeal and banana or applesauce, coffee with Hazelnut flavored creamer  Snk ( AM): no  L ( PM): chicken salad on 1 slice bread, applesauce or [place] yogurt, water or fruit drink Snk ( PM): no D ( PM): beans often OR lean meat, in chili and some avocado,  water or fruit drink  Snk ( PM): peanut cluster or a handful of chips Beverages: coffee,   Usual physical activity: she walks for 30 minutes twice every day after breakfast and before supper. She typically walks at [facility] [employer] to be in a safe place.  Estimated energy needs: 1200 calories 135 g carbohydrates 90 g protein 33 g fat    Intervention:  She continues to care for her mother so has little time for herself. Encouraged her to continue with her activity level and especially post meal to help with hyperglycemia times that will impact her A1c.  Commended her again  on her efforts toward continued good eating habits most of the time and her exercise, resulting in good BG control.   Plan:  Continue to aim for 2 Carb Choices per meal (30 grams) +/- 1 either way  Continue to aim for 0-1 Carbs per snack if hungry  Continue reading food labels for  Total Carbohydrate of foods Continue with your activity level walking around your house as tolerated Continue the Arm Chair Exercises at home when you can't walk at [facility] Continue checking BG at alternate times per day, including 2 hours after a meal occasionally  You're doing a great job! Consider checking your BG after a meal occasionally to capture that BG information    Teaching Method Utilized: Auditory  Handouts given during visit include: No new handouts today  Barriers to learning/adherence to lifestyle change: caring for elderly parent  Diabetes self-care support plan:   [facility] support group available  Demonstrated degree of understanding via:  Teach Back   Monitoring/Evaluation:  Dietary intake, exercise, reading food labels, and body weight in 1 month per patient request for support.

## 2015-06-06 NOTE — Patient Instructions (Signed)
Plan:  Continue to aim for 2 Carb Choices per meal (30 grams) +/- 1 either way  Continue to aim for 0-1 Carbs per snack if hungry  Continue reading food labels for Total Carbohydrate of foods Continue with your activity level walking around your house as tolerated Continue the Arm Chair Exercises at home when you can't walk at [facility] Continue checking BG at alternate times per day, including 2 hours after a meal occasionally  You're doing a great job! Consider checking your BG after a meal occasionally to capture that BG information

## 2015-07-02 DIAGNOSIS — E119 Type 2 diabetes mellitus without complications: Secondary | ICD-10-CM | POA: Diagnosis not present

## 2015-07-02 DIAGNOSIS — H40013 Open angle with borderline findings, low risk, bilateral: Secondary | ICD-10-CM | POA: Diagnosis not present

## 2015-07-11 ENCOUNTER — Encounter: Payer: Self-pay | Admitting: *Deleted

## 2015-07-11 ENCOUNTER — Encounter: Payer: Medicare PPO | Attending: Family Medicine | Admitting: *Deleted

## 2015-07-11 VITALS — Ht 62.0 in | Wt 133.4 lb

## 2015-07-11 DIAGNOSIS — E119 Type 2 diabetes mellitus without complications: Secondary | ICD-10-CM | POA: Insufficient documentation

## 2015-07-11 NOTE — Progress Notes (Signed)
Appt start time: 1000 end time:  1030.  Assessment:  Patient was seen on 07/11/2015 for individual diabetes education follow up visit. Weight stable today @ 133.4 lbs. She states she might have a small donut and then plann to go out for her walk later, and then falls asleep so she doesn't go for the walk. She is testing once a day now in the AM with results ranging 123-180 mg/dl.  She continues to care for her elderly mother.   Current HbA1c: 7.7% in June 2016. She gets it drawn every 6 months so next test will be in December  Preferred Learning Style:   No preference indicated   Learning Readiness:   Ready  Change in progress  MEDICATIONS: see list, no diabetes medications at this time  DIETARY INTAKE:  24-hr recall:  B ( AM): regular oatmeal and banana or applesauce, coffee with Hazelnut flavored creamer  Snk ( AM): no  L ( PM): chicken salad on 1 slice bread, applesauce or [place] yogurt, water or fruit drink Snk ( PM): no D ( PM): beans often OR lean meat, in chili and some avocado,  water or fruit drink  Snk ( PM): peanut cluster or a handful of chips Beverages: coffee,   Usual physical activity: she walks for 30 minutes twice every day after breakfast and before supper. [facility] to be in a safe place.  Estimated energy needs: 1200 calories 135 g carbohydrates 90 g protein 33 g fat    Intervention:  She continues to care for her mother so has little time for herself. Encouraged her to continue with her activity level and especially post meal or any higher carb snack to help with hyperglycemia times that will impact her A1c.  Commended her again  on her efforts toward continued good eating habits most of the time and her exercise, resulting in good BG control.   Plan:  Continue to aim for 2 Carb Choices per meal (30 grams) +/- 1 either way  Continue to aim for 0-1 Carbs per snack if hungry  Continue reading food labels for Total  Carbohydrate of foods Continue with your activity level walking around your house as tolerated Consider if you plan to have a dessert or donut, to only have it after a walk to make sure you get the walk in first Continue the Arm Chair Exercises at home when you can't walk at [facility] Continue checking BG at alternate times per day, including 2 hours after a meal occasionally  You're doing a great job! Consider checking your BG after a meal occasionally to capture that BG information    Teaching Method Utilized: Auditory  Handouts given during visit include: No new handouts today  Barriers to learning/adherence to lifestyle change: caring for elderly parent  Diabetes self-care support plan:   [facility] support group available  Demonstrated degree of understanding via:  Teach Back   Monitoring/Evaluation:  Dietary intake, exercise, reading food labels, and body weight in 1 month per patient request for support.

## 2015-07-11 NOTE — Patient Instructions (Signed)
Plan:  Continue to aim for 2 Carb Choices per meal (30 grams) +/- 1 either way  Continue to aim for 0-1 Carbs per snack if hungry  Continue reading food labels for Total Carbohydrate of foods Continue with your activity level walking around your house as tolerated Consider if you plan to have a dessert or donut, to only have it after a walk to make sure you get the walk in first Continue the Arm Chair Exercises at home when you can't walk at [facility] Continue checking BG at alternate times per day, including 2 hours after a meal occasionally  You're doing a great job! Consider checking your BG after a meal occasionally to capture that BG information

## 2015-08-15 ENCOUNTER — Encounter: Payer: Self-pay | Admitting: *Deleted

## 2015-08-15 ENCOUNTER — Encounter: Payer: Medicare PPO | Attending: Family Medicine | Admitting: *Deleted

## 2015-08-15 VITALS — Ht 62.0 in | Wt 132.0 lb

## 2015-08-15 DIAGNOSIS — E119 Type 2 diabetes mellitus without complications: Secondary | ICD-10-CM | POA: Insufficient documentation

## 2015-08-15 NOTE — Patient Instructions (Signed)
Plan:  Continue to aim for 2 Carb Choices per meal (30 grams) +/- 1 either way  Continue to aim for 0-1 Carbs per snack if hungry  Continue reading food labels for Total Carbohydrate of foods Continue with your activity level walking around your house as tolerated Continue if you plan to have a dessert or donut, to only have it after a walk to make sure you get the walk in first Continue the Arm Chair Exercises at home when you can't walk at [facility] Continue checking BG at alternate times per day, including 2 hours after a meal occasionally  You're doing a great job! Consider checking your BG after a meal occasionally to capture that BG information

## 2015-08-15 NOTE — Progress Notes (Signed)
Appt start time: 1030 end time:  1100.  Assessment:  Patient was seen on 08/15/2015 for individual diabetes education follow up visit. Weight loss of 0.5 pounds since last visit which includes the Thanksgiving holiday. Her [place] from [place], which she enjoyed. She brought her BG logs, all FBG numbers were within [employer] until she strained her back. She has implemented the idea of walking first after dinner before eating any dessert. She continues to care for her elderly mother.   Current HbA1c: 7.7% in June 2016. She gets it drawn every 6 months so next test will be in late December  Preferred Learning Style:   No preference indicated   Learning Readiness:   Ready  Change in progress  MEDICATIONS: see list, no diabetes medications at this time  DIETARY INTAKE:  24-hr recall:  B ( AM): regular oatmeal and banana or applesauce, coffee with Hazelnut flavored creamer  Snk ( AM): no  L ( PM): chicken salad on 1 slice bread, applesauce or [place] yogurt, water or fruit drink Snk ( PM): no D ( PM): beans often OR lean meat, in chili and some avocado,  water or fruit drink  Snk ( PM): peanut cluster or a handful of chips Beverages: coffee,   Usual physical activity: she is walk a littles less due to being more busy.  for 30 minutes twice every day after breakfast and before supper. [facility] [employer] to be in a safe place.  Estimated energy needs: 1200 calories 135 g carbohydrates 90 g protein 33 g fat    Intervention:  Acknowledged her efforts to increase her activity on days that she splurges with higher calorie foods.  Commended her again  on her efforts toward continued good eating habits most of the time and her exercise, resulting in good BG control.   Plan:  Continue to aim for 2 Carb Choices per meal (30 grams) +/- 1 either way  Continue to aim for 0-1 Carbs per snack if hungry  Continue reading food labels for Total  Carbohydrate of foods Continue with your activity level walking around your house as tolerated Consider if you plan to have a dessert or donut, to only have it after a walk to make sure you get the walk in first Continue the Arm Chair Exercises at home when you can't walk at [facility] Continue checking BG at alternate times per day, including 2 hours after a meal occasionally  You're doing a great job! Consider checking your BG after a meal occasionally to capture that BG information    Teaching Method Utilized: Auditory  Handouts given during visit include: No new handouts today  Barriers to learning/adherence to lifestyle change: caring for elderly parent  Diabetes self-care support plan:   [facility] support group available  Demonstrated degree of understanding via:  Teach Back   Monitoring/Evaluation:  Dietary intake, exercise, reading food labels, and body weight in 1 month per patient request for support.

## 2015-08-26 DIAGNOSIS — E7800 Pure hypercholesterolemia, unspecified: Secondary | ICD-10-CM | POA: Diagnosis not present

## 2015-08-26 DIAGNOSIS — E1165 Type 2 diabetes mellitus with hyperglycemia: Secondary | ICD-10-CM | POA: Diagnosis not present

## 2015-08-26 DIAGNOSIS — N182 Chronic kidney disease, stage 2 (mild): Secondary | ICD-10-CM | POA: Diagnosis not present

## 2015-09-26 ENCOUNTER — Encounter: Payer: Self-pay | Admitting: *Deleted

## 2015-09-26 ENCOUNTER — Encounter: Payer: Medicare Other | Attending: Family Medicine | Admitting: *Deleted

## 2015-09-26 VITALS — Ht 62.0 in | Wt 134.0 lb

## 2015-09-26 DIAGNOSIS — E119 Type 2 diabetes mellitus without complications: Secondary | ICD-10-CM

## 2015-09-26 NOTE — Progress Notes (Signed)
Appt start time: 1030 end time:  1100.  Assessment:  Patient was seen on 09/26/2015 for individual diabetes education follow up visit. She states her last A1c taken in December, 2016 was "the same" but she doesn't know what the number was. She plans to call the office to get the actual number. She still cares for her mother several hours every day. She is trying to make some of these activities more fun, rather than tedious for herself and her mother. She states she has to climb stairs and move about quite a bit with that. She is not walking outside right now partly due to it getting dark so early and partly due to time limiiations.  Current HbA1c: not sure, was told in December it was the same as last time which was 7.7% in June 2016.   Preferred Learning Style:   No preference indicated   Learning Readiness:   Ready  Change in progress  MEDICATIONS: see list, no diabetes medications at this time  DIETARY INTAKE:  24-hr recall:  B ( AM): regular oatmeal and banana or applesauce, coffee with Hazelnut flavored creamer  Snk ( AM): no  L ( PM): boiled egg with mayo spread on 1/2 whole grain bagel OR chicken salad on 1 slice bread, applesauce or [place] yogurt, water or fruit drink Snk ( PM): no D ( PM): beans often OR lean meat, mixed vegetable medley, small serving of starch   Snk ( PM): peanut cluster or a handful of chips Beverages: coffee, water or fruit drink    Usual physical activity: she is physically active caring for her mother right now. She used to walk at [facility] [employer] to be in a safe place.  Estimated energy needs: 1200 calories 135 g carbohydrates 90 g protein 33 g fat    Intervention:  Acknowledged her efforts to increase her activity on days that she splurges with higher calorie foods.  Commended her again  on her efforts toward continued good eating habits most of the time and her exercise, resulting in good BG control.   Plan:  Continue to aim for  2 Carb Choices per meal (30 grams) +/- 1 either way  Continue to aim for 0-1 Carbs per snack if hungry  Continue reading food labels for Total Carbohydrate of foods Continue with your activity level walking around your house as tolerated Consider if you plan to have a dessert or donut, to only have it after a walk to make sure you get the walk in first Continue the Arm Chair Exercises at home when you can't walk at [facility] Continue checking BG at alternate times per day, including 2 hours after a meal occasionally  You're doing a great job! Consider checking your BG after a meal occasionally to capture that BG information    Teaching Method Utilized: Auditory  Handouts given during visit include: No new handouts today  Barriers to learning/adherence to lifestyle change: caring for elderly parent  Diabetes self-care support plan:   [facility] support group available  Demonstrated degree of understanding via:  Teach Back   Monitoring/Evaluation:  Dietary intake, exercise, reading food labels, and body weight in 1 month per patient request for support.

## 2015-10-31 ENCOUNTER — Ambulatory Visit: Payer: BC Managed Care – PPO | Admitting: *Deleted

## 2015-11-07 ENCOUNTER — Encounter: Payer: Medicare Other | Attending: Family Medicine | Admitting: *Deleted

## 2015-11-07 VITALS — Ht 62.0 in | Wt 132.7 lb

## 2015-11-07 DIAGNOSIS — IMO0002 Reserved for concepts with insufficient information to code with codable children: Secondary | ICD-10-CM

## 2015-11-07 DIAGNOSIS — E119 Type 2 diabetes mellitus without complications: Secondary | ICD-10-CM | POA: Insufficient documentation

## 2015-11-07 DIAGNOSIS — E1165 Type 2 diabetes mellitus with hyperglycemia: Secondary | ICD-10-CM

## 2015-11-07 DIAGNOSIS — E118 Type 2 diabetes mellitus with unspecified complications: Secondary | ICD-10-CM

## 2015-11-07 NOTE — Patient Instructions (Signed)
Plan:  Continue to aim for 2 Carb Choices per meal (30 grams) +/- 1 either way  Continue to aim for 0-1 Carbs per snack if hungry  Continue reading food labels for Total Carbohydrate of foods Continue with your activity level walking around your house as tolerated Continue if you plan to have a dessert or donut, to only have it after a walk to make sure you get the walk in first Continue the Arm Chair Exercises at home when you can't walk at [facility] Continue checking BG at alternate times per day, including 2 hours after a meal occasionally  We discussed your A1c today and that it has not improved over the last 6 months. We then discussed medication options and how Metformin could help you with your control of your diabetes.

## 2015-11-08 ENCOUNTER — Encounter: Payer: Self-pay | Admitting: *Deleted

## 2015-11-08 NOTE — Progress Notes (Signed)
Appt start time: 1030 end time:  1100.  Assessment:  Patient was seen on 11/07/2015 for individual diabetes education follow up visit. She states her last A1c taken in December, 2016 was "the same" and she brought her lab work report which does states no change in A1c at 7.7%. She still cares for her mother several hours every day. She is trying to make some of these activities more fun, rather than tedious for herself and her mother. She states she has to climb stairs and move about quite a bit with that. She is not walking outside right now partly due to it getting dark so early and partly due to time limiiations.  Current HbA1c: 08/2016: 7.7%  Preferred Learning Style:   No preference indicated   Learning Readiness:   Ready  Change in progress  MEDICATIONS: see list, no diabetes medications at this time  DIETARY INTAKE:  24-hr recall:  B ( AM): regular oatmeal and banana or applesauce, coffee with Hazelnut flavored creamer  Snk ( AM): no  L ( PM): boiled egg with mayo spread on 1/2 whole grain bagel OR chicken salad on 1 slice bread, applesauce or [place] yogurt, water or fruit drink Snk ( PM): no D ( PM): beans often OR lean meat, mixed vegetable medley, small serving of starch   Snk ( PM): peanut cluster or a handful of chips Beverages: coffee, water or fruit drink    Usual physical activity: she is physically active caring for her mother right now. She used to walk at [facility] to be in a safe place.  Estimated energy needs: 1200 calories 135 g carbohydrates 90 g protein 33 g fat    Intervention:   I explained to her today in more detail the effects of insulin resistance and how Metformin would assist her body in decreasing the glucose from the liver and assist her insulin to move her BG into her cells. I encouraged her to follow up with her MD to consider resuming Metformin or any other appropriate medication as needed to help improve her BG control that   Commended her again  on her efforts toward continued good eating habits most of the time and her exercise, but pointed out to her that this evidently isn't adequate to control her BG alone with her A1c remaining at 7.7%.   Plan:  Continue to aim for 2 Carb Choices per meal (30 grams) +/- 1 either way  Continue to aim for 0-1 Carbs per snack if hungry  Continue reading food labels for Total Carbohydrate of foods Continue with your activity level walking around your house as tolerated Continue if you plan to have a dessert or donut, to only have it after a walk to make sure you get the walk in first Continue the Arm Chair Exercises at home when you can't walk at [facility] Continue checking BG at alternate times per day, including 2 hours after a meal occasionally  We discussed your A1c today and that it has not improved over the last 6 months. We then discussed medication options and how Metformin could help you with your control of your diabetes.    Teaching Method Utilized: Auditory  Handouts given during visit include: Diabetes Medication handout  Barriers to learning/adherence to lifestyle change: caring for elderly parent  Diabetes self-care support plan:   [facility] support group available  Demonstrated degree of understanding via:  Teach Back   Monitoring/Evaluation:  Dietary intake, exercise, reading food labels, and body  weight in 1 month per patient request for support.

## 2015-12-19 ENCOUNTER — Encounter: Payer: Medicare Other | Attending: Family Medicine | Admitting: *Deleted

## 2015-12-19 ENCOUNTER — Encounter: Payer: Self-pay | Admitting: *Deleted

## 2015-12-19 VITALS — Ht 62.0 in | Wt 131.4 lb

## 2015-12-19 DIAGNOSIS — E118 Type 2 diabetes mellitus with unspecified complications: Secondary | ICD-10-CM

## 2015-12-19 DIAGNOSIS — E1165 Type 2 diabetes mellitus with hyperglycemia: Secondary | ICD-10-CM

## 2015-12-19 DIAGNOSIS — E119 Type 2 diabetes mellitus without complications: Secondary | ICD-10-CM | POA: Diagnosis present

## 2015-12-19 DIAGNOSIS — IMO0002 Reserved for concepts with insufficient information to code with codable children: Secondary | ICD-10-CM

## 2015-12-19 NOTE — Patient Instructions (Signed)
Plan:  Continue to aim for 2 Carb Choices per meal (30 grams) +/- 1 either way  Continue to aim for 0-1 Carbs per snack if hungry  Continue reading food labels for Total Carbohydrate of foods Continue with your activity level walking around your house as tolerated Continue if you plan to have a dessert or donut, to only have it after a walk to make sure you get the walk in first Continue the Arm Chair Exercises at home when [facility] Continue checking BG at alternate times per day, including 2 hours after a meal occasionally  I am proud of you for being willing to take the Metformin again and that you are seeing improved BG's now.

## 2015-12-19 NOTE — Progress Notes (Signed)
Appt start time: 1000 end time:  1030.  Assessment:  Patient was seen on 12/19/2015 for individual diabetes education follow up visit. She states she went to her MD to discuss going back on Metformin which she is now taking successfully. She states her FBG are now consistently under 120 mg/dl!  She is happy with the stretches she does daily and walks every day. She still cares for her mother several hours every day.   Current HbA1c: 08/2016: 7.7% She states her next A1c will be in July, 2017  Preferred Learning Style:   No preference indicated   Learning Readiness:   Ready  Change in progress  MEDICATIONS: see list, no diabetes medications at this time  DIETARY INTAKE:  24-hr recall:  B ( AM): regular oatmeal and banana or applesauce, coffee with Hazelnut flavored creamer  Snk ( AM): no  L ( PM): boiled egg with mayo spread on 1/2 whole grain bagel OR chicken salad on 1 slice bread, applesauce or [place] yogurt, water or fruit drink Snk ( PM): no D ( PM): beans often OR lean meat, mixed vegetable medley, small serving of starch   Snk ( PM): peanut cluster or a handful of chips Beverages: coffee, water or fruit drink    Usual physical activity: she is physically active caring for her mother right now. She states she continues with her stretches each day and walking  Estimated energy needs: 1200 calories 135 g carbohydrates 90 g protein 33 g fat    Intervention:  Commended her again  on her efforts toward continued good eating habits most of the time and her exercise. Reinforced the action of Metformin and that it does not cause low BG. Suggested that if she feels shakey, to check her BG to rule out hypoglycemia and to eat for hunger.   Plan:  Continue to aim for 2 Carb Choices per meal (30 grams) +/- 1 either way  Continue to aim for 0-1 Carbs per snack if hungry  Continue reading food labels for Total Carbohydrate of foods Continue with your activity level walking around your  house as tolerated Continue if you plan to have a dessert or donut, to only have it after a walk to make sure you get the walk in first Continue the Arm Chair Exercises at home when you [facility]'t walk [facility] Continue checking BG at alternate times per day, including 2 hours after a meal occasionally  I am proud of you for being willing to take the Metformin again and that you are seeing improved BG's now.     Teaching Method Utilized: Auditory  Handouts given during visit include: No handouts today  Barriers to learning/adherence to lifestyle change: caring for elderly parent  Diabetes self-care support plan:   [facility] support [facility] available  Demonstrated degree of understanding via:  Teach Back   Monitoring/Evaluation:  Dietary intake, exercise, reading food labels, and body weight in 1 month per patient request for support.

## 2016-01-23 ENCOUNTER — Ambulatory Visit: Payer: Medicare Other | Admitting: *Deleted

## 2016-03-04 ENCOUNTER — Encounter: Payer: Self-pay | Admitting: *Deleted

## 2016-03-04 ENCOUNTER — Encounter: Payer: Medicare Other | Attending: Family Medicine | Admitting: *Deleted

## 2016-03-04 VITALS — Ht 62.0 in | Wt 130.2 lb

## 2016-03-04 DIAGNOSIS — E119 Type 2 diabetes mellitus without complications: Secondary | ICD-10-CM | POA: Diagnosis present

## 2016-03-04 NOTE — Progress Notes (Signed)
Appt start time: 1130 end time:  1200.  Assessment:  Patient was seen on 03/04/2016 for individual diabetes education follow up visit. She is very excited about her most recent A1c results that decreased from 7.7% to 7.3% since she started on Metformin about 3 months ago. She continues to be content with her carb counting and getting her exercise going up and down the stairs in her home taking care of her invalid mother. She did not bring her BG logs today, she was so excited to share her last A1c results.She continues with the stretches she does daily and walks every day. She still cares for her mother several hours every day.   Current HbA1c: 02/2716: 7.3% !  Down from 7.7% 3 months ago  Preferred Learning Style:   No preference indicated   Learning Readiness:   Ready  Change in progress  MEDICATIONS: see list, no diabetes medications at this time  DIETARY INTAKE:  24-hr recall:  B ( AM): regular oatmeal and banana or applesauce, coffee with Hazelnut flavored creamer  Snk ( AM): no  L ( PM): boiled egg with mayo spread on 1/2 whole grain bagel OR chicken salad on 1 slice bread, applesauce or [place] yogurt, water or fruit drink Snk ( PM): no D ( PM): beans often OR lean meat, mixed vegetable medley, small serving of starch   Snk ( PM): peanut cluster or a handful of chips Beverages: coffee, water or fruit drink    Usual physical activity: she is physically active caring for her mother right now. She states she continues with her stretches each day and walking  Estimated energy needs: 1200 calories 135 g carbohydrates 90 g protein 33 g fat    Intervention:  Commended her again  on her continued efforts towards good eating habits most of the time and her exercise plan.   Plan:  Continue to aim for 2 Carb Choices per meal (30 grams) +/- 1 either way  Continue to aim for 0-1 Carbs per snack if hungry  Continue reading food labels for Total Carbohydrate of foods Continue with your  activity level walking around your house as tolerated Continue if you plan to have a dessert or donut, to only have it after a walk to make sure you get the walk in first Continue the Arm Chair Exercises at home when you [facility]'t walk [facility] Continue checking BG at alternate times per day, including 2 hours after a meal occasionally  I am proud of you for being willing to take the Metformin again and that you are seeing improved BG's now.     Teaching Method Utilized: Auditory  Handouts given during visit include: No handouts today  Barriers to learning/adherence to lifestyle change: caring for elderly parent  Diabetes self-care support plan:   [facility] support group available  Demonstrated degree of understanding via:  Teach Back   Monitoring/Evaluation:  Dietary intake, exercise, reading food labels, and body weight in 1 month per patient request for support.

## 2016-04-02 ENCOUNTER — Ambulatory Visit: Payer: BC Managed Care – PPO | Admitting: *Deleted

## 2016-04-22 ENCOUNTER — Encounter: Payer: Medicare Other | Attending: Family Medicine | Admitting: *Deleted

## 2016-04-22 DIAGNOSIS — E119 Type 2 diabetes mellitus without complications: Secondary | ICD-10-CM

## 2016-04-22 NOTE — Progress Notes (Signed)
Appt start time: 1100 end time:  1130.  Assessment:  Patient was seen on 04/22/2016 for individual diabetes education follow up visit. She states she had some arm pain after starting the Metformin. Even though her BG had improved, this concerned her so she spoke with her MD who offered for her to stop the Metformin and see if that relieved her arm pain and how her BG would respond. She states she feels better and her Log Book indicates her FBG are usually below 150 mg/dl. She continues with the stretches she does daily and walks every day. She still cares for her mother several hours every day.   Current HbA1c: 02/2716: 7.3%   Preferred Learning Style:   No preference indicated   Learning Readiness:   Ready  Change in progress  MEDICATIONS: see list, no diabetes medications at this time  DIETARY INTAKE:  24-hr recall:  B ( AM): regular oatmeal and banana or applesauce, coffee with Hazelnut flavored creamer  Snk ( AM): no  L ( PM): boiled egg with mayo spread on 1/2 whole grain bagel OR chicken salad on 1 slice bread, applesauce or [place] yogurt, water or fruit drink Snk ( PM): no D ( PM): beans often OR lean meat, mixed vegetable medley, small serving of starch   Snk ( PM): peanut cluster or a handful of chips Beverages: coffee, water or fruit drink    Usual physical activity: she is physically active caring for her mother right now. She states she continues with her stretches each day and walking  Estimated energy needs: 1200 calories 135 g carbohydrates 90 g protein 33 g fat    Intervention:  Commended her again  on her continued efforts towards good eating habits most of the time and her exercise plan.   Plan:  Continue to aim for 2 Carb Choices per meal (30 grams) +/- 1 either way  Continue to aim for 0-1 Carbs per snack if hungry  Continue reading food labels for Total Carbohydrate of foods Continue with your activity level walking around your house as tolerated Continue  if you plan to have a dessert or donut, to only have it after a walk to make sure you get the walk in first Continue the Arm Chair Exercises at home when you can't walk [facility] Continue checking BG at alternate times per day, including 2 hours after a meal occasionally  As long as your BG stays under 150 mg/dl, then you are in good control   Teaching Method Utilized: Auditory  Handouts given during visit include: No handouts today  Barriers to learning/adherence to lifestyle change: caring for elderly parent  Diabetes self-care support plan:   [facility] support group available  Demonstrated degree of understanding via:  Teach Back   Monitoring/Evaluation:  Dietary intake, exercise, reading food labels, and body weight in 1 month per patient request for support.

## 2016-04-22 NOTE — Patient Instructions (Signed)
Plan:  Continue to aim for 2 Carb Choices per meal (30 grams) +/- 1 either way  Continue to aim for 0-1 Carbs per snack if hungry  Continue reading food labels for Total Carbohydrate of foods Continue with your activity level walking around your house as tolerated Continue if you plan to have a dessert or donut, to only have it after a walk to make sure you get the walk in first Continue the Arm Chair Exercises at home when [facility]'t [facility] Continue checking BG at alternate times per day, including 2 hours after a meal occasionally  As long as your BG stays under 150 mg/dl, then you are in good control

## 2016-05-20 ENCOUNTER — Encounter: Payer: Medicare Other | Attending: Family Medicine | Admitting: *Deleted

## 2016-05-20 DIAGNOSIS — E119 Type 2 diabetes mellitus without complications: Secondary | ICD-10-CM

## 2016-05-20 NOTE — Progress Notes (Signed)
Appt start time: 1130 end time:  1200.  Assessment:  Patient was seen on 05/20/2016 for individual diabetes education follow up visit. She is still off of the Metformin, stating her MD wants to see another A1c in 3 months before deciding if she still needs it or not. She states her long time meter, Accu Chek Compact is no longer working and she has not been able to find a replacement. She likes that the strips come in a drum. Her Log Book indicates her FBG are usually below 150 mg/dl. She continues with the stretches she does daily and walks every day. She still cares for her mother several hours every day.   Current HbA1c: 02/2716: 7.3%   Preferred Learning Style:   No preference indicated   Learning Readiness:   Ready  Change in progress  MEDICATIONS: see list, no diabetes medications at this time  DIETARY INTAKE:  24-hr recall:  B ( AM): regular oatmeal and banana or applesauce, coffee with Hazelnut flavored creamer  Snk ( AM): no  L ( PM): boiled egg with mayo spread on 1/2 whole grain bagel OR chicken salad on 1 slice bread, applesauce or [place] yogurt, water or fruit drink Snk ( PM): no D ( PM): beans often OR lean meat, mixed vegetable medley, small serving of starch   Snk ( PM): peanut cluster or a handful of chips Beverages: coffee, water or fruit drink    Usual physical activity: she is physically active caring for her mother right now. She states she continues with her stretches each day and walking  Estimated energy needs: 1200 calories 135 g carbohydrates 90 g protein 33 g fat    Intervention:  Provided her with information on ordering another Accu Chek Compact meter from [employer]. Reinforced her positive attitude towards her eating and activity habits.   Plan:  Continue to aim for 2 Carb Choices per meal (30 grams) +/- 1 either way  Continue to aim for 0-1 Carbs per snack if hungry  Continue reading food labels for Total Carbohydrate of foods Continue with your  activity level walking around your house as tolerated We have found the Accu Chek Compact meter on [employer] so you can order a replacement. Continue checking BG at alternate times per day, including 2 hours after a meal occasionally  As long as your BG stays under 150 mg/dl, then you are in good control   Teaching Method Utilized: Auditory  Handouts given during visit include: No handouts today  Barriers to learning/adherence to lifestyle change: caring for elderly parent  Diabetes self-care support plan:   [facility] support group available  Demonstrated degree of understanding via:  Teach Back   Monitoring/Evaluation:  Dietary intake, exercise, reading food labels, and body weight in 1 month per patient request for support.

## 2016-05-26 NOTE — Patient Instructions (Signed)
Plan:  Continue to aim for 2 Carb Choices per meal (30 grams) +/- 1 either way  Continue to aim for 0-1 Carbs per snack if hungry  Continue reading food labels for Total Carbohydrate of foods Continue with your activity level walking around your house as tolerated We have found the Accu Chek Compact meter on [employer] so you can order a replacement. Continue checking BG at alternate times per day, including 2 hours after a meal occasionally  As long as your BG stays under 150 mg/dl, then you are in good control

## 2016-06-24 ENCOUNTER — Ambulatory Visit: Payer: BC Managed Care – PPO | Admitting: *Deleted

## 2016-12-24 ENCOUNTER — Encounter (HOSPITAL_COMMUNITY): Payer: Self-pay | Admitting: Family Medicine

## 2016-12-24 ENCOUNTER — Ambulatory Visit (HOSPITAL_COMMUNITY)
Admission: EM | Admit: 2016-12-24 | Discharge: 2016-12-24 | Disposition: A | Discharge status: Home / Self Care | Payer: Medicare Other | Attending: Family Medicine | Admitting: Family Medicine

## 2016-12-24 DIAGNOSIS — I10 Essential (primary) hypertension: Secondary | ICD-10-CM | POA: Diagnosis not present

## 2016-12-24 DIAGNOSIS — F419 Anxiety disorder, unspecified: Secondary | ICD-10-CM

## 2016-12-24 MED ORDER — HYDROXYZINE HCL 25 MG PO TABS: 25.0000 mg | ORAL_TABLET | Freq: Four times a day (QID) | ORAL | 0 refills | Status: DC
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
## 2016-12-30 ENCOUNTER — Other Ambulatory Visit: Payer: Self-pay | Admitting: Family Medicine

## 2016-12-30 DIAGNOSIS — R202 Paresthesia of skin: Principal | ICD-10-CM

## 2016-12-30 DIAGNOSIS — R200 Anesthesia of skin: Secondary | ICD-10-CM

## 2017-01-12 ENCOUNTER — Ambulatory Visit (HOSPITAL_COMMUNITY)
Admission: EM | Admit: 2017-01-12 | Discharge: 2017-01-12 | Disposition: A | Discharge status: Nursing Home (Includes ICF) | Payer: Medicare Other | Attending: Internal Medicine | Admitting: Internal Medicine

## 2017-01-12 ENCOUNTER — Emergency Department (HOSPITAL_COMMUNITY): Payer: Medicare Other

## 2017-01-12 ENCOUNTER — Encounter (HOSPITAL_COMMUNITY): Payer: Self-pay | Admitting: Nurse Practitioner

## 2017-01-12 ENCOUNTER — Encounter (HOSPITAL_COMMUNITY): Payer: Self-pay | Admitting: Emergency Medicine

## 2017-01-12 ENCOUNTER — Emergency Department (HOSPITAL_COMMUNITY)
Admission: EM | Admit: 2017-01-12 | Discharge: 2017-01-12 | Disposition: A | Discharge status: Home / Self Care | Payer: Medicare Other | Attending: Emergency Medicine | Admitting: Emergency Medicine

## 2017-01-12 DIAGNOSIS — Z7984 Long term (current) use of oral hypoglycemic drugs: Secondary | ICD-10-CM | POA: Diagnosis not present

## 2017-01-12 DIAGNOSIS — M5020 Other cervical disc displacement, unspecified cervical region: Secondary | ICD-10-CM | POA: Insufficient documentation

## 2017-01-12 DIAGNOSIS — R202 Paresthesia of skin: Secondary | ICD-10-CM | POA: Diagnosis not present

## 2017-01-12 DIAGNOSIS — Z7982 Long term (current) use of aspirin: Secondary | ICD-10-CM | POA: Insufficient documentation

## 2017-01-12 DIAGNOSIS — I10 Essential (primary) hypertension: Secondary | ICD-10-CM | POA: Diagnosis not present

## 2017-01-12 DIAGNOSIS — R200 Anesthesia of skin: Secondary | ICD-10-CM

## 2017-01-12 DIAGNOSIS — R531 Weakness: Secondary | ICD-10-CM | POA: Diagnosis not present

## 2017-01-12 DIAGNOSIS — E119 Type 2 diabetes mellitus without complications: Secondary | ICD-10-CM | POA: Insufficient documentation

## 2017-01-12 DIAGNOSIS — Z87891 Personal history of nicotine dependence: Secondary | ICD-10-CM | POA: Insufficient documentation

## 2017-01-12 DIAGNOSIS — M542 Cervicalgia: Secondary | ICD-10-CM | POA: Diagnosis not present

## 2017-01-12 LAB — CBC
HCT: 40.4 % (ref 36.0–46.0)
Hemoglobin: 13 g/dL (ref 12.0–15.0)
MCH: 25.4 pg — AB (ref 26.0–34.0)
MCHC: 32.2 g/dL (ref 30.0–36.0)
MCV: 79.1 fL (ref 78.0–100.0)
Platelets: 232 10*3/uL (ref 150–400)
RBC: 5.11 MIL/uL (ref 3.87–5.11)
RDW: 14.6 % (ref 11.5–15.5)
WBC: 6.9 10*3/uL (ref 4.0–10.5)

## 2017-01-12 LAB — BASIC METABOLIC PANEL
Anion gap: 11 (ref 5–15)
BUN: 8 mg/dL (ref 6–20)
CHLORIDE: 104 mmol/L (ref 101–111)
CO2: 24 mmol/L (ref 22–32)
CREATININE: 0.79 mg/dL (ref 0.44–1.00)
Calcium: 9.3 mg/dL (ref 8.9–10.3)
GFR calc Af Amer: >60 mL/min (ref >60)
GFR calc non Af Amer: >60 mL/min (ref >60)
GLUCOSE: 151 mg/dL — AB (ref 65–99)
Potassium: 3.5 mmol/L (ref 3.5–5.1)
Sodium: 139 mmol/L (ref 135–145)

## 2017-01-12 MED ORDER — LORAZEPAM 2 MG/ML IJ SOLN: 1.0000 mg | Freq: Once | INTRAMUSCULAR | Status: DC | PRN
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
## 2017-01-12 NOTE — ED Notes (Signed)
Patient transported to MRI 

## 2017-01-12 NOTE — ED Notes (Signed)
ED Provider at bedside. 

## 2017-01-12 NOTE — Discharge Instructions (Signed)
Go to the ED for evaluation
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
## 2017-01-15 ENCOUNTER — Ambulatory Visit
Admission: RE | Admit: 2017-01-15 | Discharge: 2017-01-15 | Disposition: A | Discharge status: Home / Self Care | Payer: Medicare Other | Source: Ambulatory Visit | Attending: Family Medicine | Admitting: Family Medicine

## 2017-01-15 ENCOUNTER — Other Ambulatory Visit: Payer: Medicare Other

## 2017-01-15 DIAGNOSIS — R200 Anesthesia of skin: Secondary | ICD-10-CM

## 2017-01-15 DIAGNOSIS — R202 Paresthesia of skin: Principal | ICD-10-CM

## 2017-02-28 ENCOUNTER — Encounter (HOSPITAL_COMMUNITY): Payer: Self-pay | Admitting: Emergency Medicine

## 2017-02-28 ENCOUNTER — Emergency Department (HOSPITAL_COMMUNITY)
Admission: EM | Admit: 2017-02-28 | Discharge: 2017-02-28 | Disposition: A | Discharge status: Home / Self Care | Payer: Medicare Other | Attending: Emergency Medicine | Admitting: Emergency Medicine

## 2017-02-28 DIAGNOSIS — E119 Type 2 diabetes mellitus without complications: Secondary | ICD-10-CM | POA: Diagnosis not present

## 2017-02-28 DIAGNOSIS — I10 Essential (primary) hypertension: Secondary | ICD-10-CM | POA: Diagnosis not present

## 2017-02-28 DIAGNOSIS — Z7984 Long term (current) use of oral hypoglycemic drugs: Secondary | ICD-10-CM | POA: Diagnosis not present

## 2017-02-28 DIAGNOSIS — Z87891 Personal history of nicotine dependence: Secondary | ICD-10-CM | POA: Diagnosis not present

## 2017-02-28 DIAGNOSIS — Z7982 Long term (current) use of aspirin: Secondary | ICD-10-CM | POA: Insufficient documentation

## 2017-02-28 DIAGNOSIS — N939 Abnormal uterine and vaginal bleeding, unspecified: Secondary | ICD-10-CM | POA: Insufficient documentation

## 2017-02-28 DIAGNOSIS — Z79899 Other long term (current) drug therapy: Secondary | ICD-10-CM | POA: Insufficient documentation

## 2017-02-28 LAB — I-STAT CHEM 8, ED
BUN: 8 mg/dL (ref 6–20)
Calcium, Ion: 1.19 mmol/L (ref 1.15–1.40)
Chloride: 103 mmol/L (ref 101–111)
Creatinine, Ser: 0.8 mg/dL (ref 0.44–1.00)
Glucose, Bld: 135 mg/dL — ABNORMAL HIGH (ref 65–99)
HEMATOCRIT: 35 % — AB (ref 36.0–46.0)
HEMOGLOBIN: 11.9 g/dL — AB (ref 12.0–15.0)
POTASSIUM: 3.6 mmol/L (ref 3.5–5.1)
SODIUM: 142 mmol/L (ref 135–145)
TCO2: 28 mmol/L (ref 0–100)

## 2017-02-28 LAB — WET PREP, GENITAL
CLUE CELLS WET PREP: NONE SEEN
Sperm: NONE SEEN
Trich, Wet Prep: NONE SEEN
Yeast Wet Prep HPF POC: NONE SEEN

## 2017-02-28 NOTE — ED Notes (Signed)
Provided patient with ginger ale. Pt sitting on side of bed comfortably, watching TV.

## 2017-02-28 NOTE — Discharge Instructions (Signed)
Please call your PCP Monday for close follow-up. You will need a biopsy of your uterus to see the cause of bleeding Return for worsening symptoms, including worsening bleeding (> 1 pad per hour), passing out, or any other symptoms concerning to you.
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
## 2017-02-28 NOTE — ED Notes (Signed)
Pelvic cart at bedside. 

## 2017-02-28 NOTE — ED Notes (Signed)
MD at bedside. 

## 2017-02-28 NOTE — ED Provider Notes (Signed)
[facility]-EMERGENCY DEPT Provider Note   CSN: [id number] Arrival date & time: 02/28/17  1007     History   Chief Complaint Chief Complaint  Patient presents with  . Vaginal Bleeding    HPI Barbara Rodriguez is a 76 y.o. female.  The history is provided by the patient.  Vaginal Bleeding  Primary symptoms include vaginal bleeding.  Primary symptoms include no discharge, no pelvic pain, no genital pain, no genital rash, no dysuria. There has been no fever. This is a new problem. The current episode started yesterday. The problem occurs daily. The problem has been resolved. The symptoms occur spontaneously. She is not pregnant. The discharge was normal. Pertinent negatives include no diaphoresis, no abdominal swelling, no abdominal pain, no constipation, no diarrhea, no nausea, no vomiting, no light-headedness and no dizziness. She has tried nothing for the symptoms. Sexual activity: non-contributory. She uses nothing for contraception.    76 year old female who presents with vaginal bleeding. History of hypertension and diabetes. She is not anticoagulated. Reports yesterday noticing vaginal spotting in her underwear. Similar to when she's had endometrial polyp before, which she states was removed in her family medicine doctors office. Yesterday evening noticed a little bit more bleeding without any passage of clots. I decided to come to the ED for evaluation today. No melena or hematochezia/rectal bleeding, dysuria, urinary frequency, hematuria, abdominal pain, fevers or chills, abnormal vaginal discharge, syncope or near syncope. Past Medical History:  Diagnosis Date  . Diabetes mellitus without complication (HCC)   . Hypertension     There are no active problems to display for this patient.   History reviewed. No pertinent surgical history.  OB History    No data available       Home Medications    Prior to Admission medications   Medication Sig Start Date End Date Taking?  Authorizing Provider  aspirin 81 MG tablet Take 81 mg by mouth daily. Reported on 09/26/2015    [provider]  cholecalciferol (VITAMIN D) 400 UNITS TABS tablet Take 400 Units by mouth every other day.    [provider]  hydrOXYzine (ATARAX/VISTARIL) 25 MG tablet Take 1 tablet (25 mg total) by mouth every 6 (six) hours. 12/24/16   Deatra Canterxford, William J, FNP  losartan (COZAAR) 25 MG tablet Take 25 mg by mouth daily.    [provider]  metFORMIN (GLUCOPHAGE) 500 MG tablet Take 500 mg by mouth daily with breakfast.    [provider]    Family History No family history on file.  Social History Social History  Substance Use Topics  . Smoking status: Former Smoker    Quit date: 08/19/1983  . Smokeless tobacco: Never Used  . Alcohol use No     Allergies   Align [acidophilus]; Clindamycin/lincomycin; Food; Ramipril; and Amoxicillin   Review of Systems Review of Systems  Constitutional: Negative for diaphoresis.  HENT: Negative for congestion.   Respiratory: Negative for shortness of breath.   Cardiovascular: Negative for chest pain.  Gastrointestinal: Negative for abdominal pain, constipation, diarrhea, nausea and vomiting.  Genitourinary: Positive for vaginal bleeding. Negative for dysuria and pelvic pain.  Allergic/Immunologic: Negative for immunocompromised state.  Neurological: Negative for dizziness and light-headedness.  Hematological: Does not bruise/bleed easily.  Psychiatric/Behavioral: Negative for confusion.  All other systems reviewed and are negative.    Physical Exam Updated Vital Signs BP (!) 167/81   Pulse (!) 56   Temp 98.1 F (36.7 C) (Oral)   Resp 16   Ht  5' 2.5" (1.588 m)   Wt 61.2 kg (135 lb)   SpO2 100%   BMI 24.30 kg/m   Physical Exam Physical Exam  Nursing note and vitals reviewed. Constitutional: Well developed, well nourished, non-toxic, and in no acute distress Head: Normocephalic and atraumatic.    Mouth/Throat: Oropharynx is clear and moist.  Neck: Normal range of motion. Neck supple.  Cardiovascular: Normal rate and regular rhythm.   Pulmonary/Chest: Effort normal and breath sounds normal.  Abdominal: Soft. There is no tenderness. There is no rebound and no guarding.  Musculoskeletal: Normal range of motion.  Neurological: Alert, no facial droop, fluent speech, moves all extremities symmetrically Skin: Skin is warm and dry.  Psychiatric: Cooperative   ED Treatments / Results  Labs (all labs ordered are listed, but only abnormal results are displayed) Labs Reviewed  WET PREP, GENITAL - Abnormal; Notable for the following:       Result Value   WBC, Wet Prep HPF POC FEW (*)    All other components within normal limits  I-STAT CHEM 8, ED - Abnormal; Notable for the following:    Glucose, Bld 135 (*)    Hemoglobin 11.9 (*)    HCT 35.0 (*)    All other components within normal limits    EKG  EKG Interpretation None       Radiology No results found.  Procedures Procedures (including critical care time)  Medications Ordered in ED Medications - No data to display   Initial Impression / Assessment and Plan / ED Course  I have reviewed the triage vital signs and the nursing notes.  Pertinent labs & imaging results that were available during my care of the patient were reviewed by me and considered in my medical decision making (see chart for details).     History Of endometrial polyp, who presents with vaginal spotting. Is well-appearing no acute distress. No pelvic pain in abdomen is soft and benign. Hemodynamically stable. With mild anemia, but no concerns for serious bleeding. Pelvic exam does show blood clot and some scant blood in the vagina, but no significant active bleeding. Patient will have close follow-up with her PCP who did her endometrial biopsy several years ago. Strict return and follow-up instructions reviewed. She expressed understanding of all  discharge instructions and felt comfortable with the plan of care.   Final Clinical Impressions(s) / ED Diagnoses   Final diagnoses:  Vaginal bleeding    New Prescriptions New Prescriptions   No medications on file     Lavera Guise, MD 02/28/17 1406

## 2017-02-28 NOTE — ED Triage Notes (Signed)
Pt. Stated, I started having some vaginal bleeding yesterday and I was going to wait but it was a litle more than I would want it to be.

## 2017-02-28 NOTE — ED Notes (Signed)
Patient verbalized understanding of discharge instructions and denies any further needs or questions at this time. VS stable. Patient ambulatory with steady gait. Declined wheelchair, RN escorted to ED entrance.

## 2017-05-04 ENCOUNTER — Other Ambulatory Visit: Payer: Self-pay | Admitting: Obstetrics & Gynecology

## 2017-05-06 NOTE — H&P (Signed)
76yo PM female who presents for hysteroscopy, D&C, myosure polypectomy due to uterine polyp and postmenopausal bleeding.  In review, she had bleeding about 1-2 mos ago and lasted for about 1-2 weeks. The bleeding was bright red and very light and has now resolved. Denies abnormal discharge, itching or irritation. Denies pelvic or abdominal pain. Denies any urinary concerns.  After those 2 weeks, she has had no further bleeding.  An SHG was performed that showed the following:  6.5cm retroverted uterus- 5 small fibroids- all <1cm in size. Thickened endometrium with multiple cystic areas noted, no blood flow noted. Normal ovaries bilaterally. With SHG: 3.2x1.2cm with multiple tiny cystic areas throughout mass. 2nd hyperechoic mass fundal wall 1cm in size.      ROS:  CONSTITUTIONAL:  no Chills. no Fever. no Night sweats.  HEENT:  Blurrred vision no. no Double vision.  CARDIOLOGY:  no Chest pain.  RESPIRATORY:  no Shortness of breath. no Cough.  UROLOGY:  no Urinary frequency. no Urinary incontinence. no Urinary urgency.  GASTROENTEROLOGY:  no Abdominal pain. no Appetite change. no Change in bowel movements.  FEMALE REPRODUCTIVE:  no Breast lumps or discharge. no Breast pain. no Vaginal irritation. no Vaginal itching.  NEUROLOGY:  no Dizziness. no Headache. no Loss of consciousness.  PSYCHOLOGY:  no Anxiety. no Depression.  SKIN:  no Rash. no Hives.  HEMATOLOGY/LYMPH:  no Anemia. no Fatigue. Using Blood Thinners no.         Medical History: DM II, Hypertension-whitecoat syndrome, hypercholesterolemia-patient refuses cholesterol Rx, Anxiety/panic attacks/depression, negative rheumatology workup-Dr. Jimmy FootmanZieminski 2009 (seen once), vitamin D deficiency, 2002 colon diverticuli on colonoscopy, History of uterine fibroids, Allergic rhinitis, DERM: Dr. Terri PiedraLupton, CARDIO: Dr. Caryn SectionFox (no follow-up), CKD Stage II, GFR 78, DECLINES FLU, ZOSTAVAX vaccines, Glaucoma, OPTHA: Dr. 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         Gyn History:  Sexual activity not currently sexually active.  Periods : postmenopausal.  Denies H/O LMP.  Denies H/O Birth control.  Last pap smear date 2012, Polyp removed .  Last mammogram date 01/24/2015.  Abnormal pap smear none.  Menarche 12.        OB History:  Number of pregnancies 3.  Pregnancy # 1 live birth, vaginal delivery, girl.  Pregnancy # 2 abortion.  Pregnancy # 3 miscarriage.        Surgical History: Vaginal Polyp removed (benign biopsy) 2012, Colonoscopy 01/2012, eye surgery on both eyes .        Hospitalization/Major Diagnostic Procedure: child birth times 1 , none in past year 2018.        Family History: Father: deceased, passed away when patient was 76 y/o. Mother: alive 76 yrs, thyroidectomy, DM II, diabetes, HTN, lives with patient, Dementia , diagnosed with Diabetes. Brother 1: deceased 76 yrs, brain lesions. Daughter(s): alive, A + W. 1 brother(s) . Marland Kitchen.  No family hx of GYN cancer  Neg GI Family hx.       Social History:  General:  Tobacco use  cigarettes: Former smoker Quit in year 1980's Tobacco history last updated 05/04/2017 no EXPOSURE TO PASSIVE SMOKE.  no Alcohol.  Caffeine: yes, 1 serving daily of coffee.  no Recreational drug use.  Exercise: yes, Taking care of mother, walk 30 min daily.  Marital Status: married.  Children: 1 daughter.  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  Cohabitating: elderly mom lives with patient.        Medications: Taking Accu-Chek Compact(Blood Glucose Test) Compact Plus ( Drum) Strips as directed In Vitro testing 2-3 times a day Dx: E11.65, Taking Accu-Chek Compact(Blood Glucose Test) 0 Strip as directed In Vitro testing 2-3  times a day Dx: 250.42, Taking Losartan Potassium 25 Tablet 1 tablet Orally Once a day, may take 1 more tablet with BP >140/90, Taking Metformin HCl 500 MG Tablet 0.5 tablet Orally Twice a day, Taking Systane Balance(Propylene Glycol) 0.6 % Solution Ophthalmic , Taking Zyrtec Allergy(Cetirizine HCl) 10 MG Tablet 0.5 tablet Orally Once a day, Notes: alternates yearly, Taking Vitamin D (Ergocalciferol) 50000 UNIT Capsule 1 capsule Orally once weekly, Medication List reviewed and reconciled with the patient       Allergies: Align when taking with taking diabetes sugar drops: weak sugar drops: Side Effects, Red Yeast Rice: Muscule aches : Side Effects, Metformin: GI upset: Side Effects, Amoxicillin: indegestion: Side Effects, Clindamycin HCl: weak and jitttery: Side Effects, Erythromycin: indigestion: Side Effects, Ramipril: possible rash: Allergy, Zithromax Z-pak: indigestion, jittery, weak: Side Effects.       Objective:     Vitals: Wt 133, Ht 61, BMI 25.13, Pulse sitting 55, BP sitting 190/68.       Examination:  General Examination:  GENERAL APPEARANCE well developed, well nourished .  SKIN: warm and dry, no rashes .  NECK: supple, normal appearance .  LUNGS: regular breathing rate and effort .  ABDOMEN: soft and not tender, no masses palpated, no rebound, no rigidity.  FEMALE GENITOURINARY: normal external genitalia, labia - unremarkable, vagina - pink moist mucosa, no lesions or abnormal discharge, cervix - no discharge or lesions or CMT, no evidence of bleeding noted, adnexa - no masses or tenderness, uterus - nontender and normal size on palpation.  MUSCULOSKELETAL no calf tenderness bilaterally .  EXTREMITIES: no edema present .  PSYCH: appropriate mood and affect .   A/P: 76yo postmenopausal female who presents for hysteroscopy, D&C, myosure polypectomy -NPO -LR @ 125cc/hr -SCDs to OR -Risk,benefit and alternatives reviewed with patient including but not limited to risk of bleeding,  infection and injury to surrounding organs including uterine perforation.  Questions and concerns were addressed and pt wishes to proceed.  Myna Hidalgo, DO [phone] (pager) [phone] (office)

## 2017-05-13 NOTE — Patient Instructions (Signed)
Your procedure is scheduled on:  Thursday, Sept. 13, 2018  [facility]  6[facility]00 AM  Pick up the phone at the desk and dial [phone].  Call this number if you have problems the morning of surgery: [phone].  Remember: Do NOT eat food or drink after:  Midnight Wednesday  Take these medicines the morning of surgery with a SIP OF WATER:  None  Stop ALL herbal medications at this time  Do NOT smoke the day of surgery.  Do NOT wear jewelry (body piercing), metal hair clips/bobby pins, make-up, artifical eyelashes or nail polish. Do NOT wear lotions, powders, or perfumes.  You may wear deodorant. Do NOT shave for 48 hours prior to surgery. Do NOT bring valuables to the hospital. Contacts, dentures, or bridgework may not be worn into surgery.  Have a responsible adult drive you home and stay with you for 24 hours after your procedure  Bring a copy of your healthcare power of attorney and living will documents.

## 2017-05-14 ENCOUNTER — Other Ambulatory Visit: Payer: Self-pay

## 2017-05-14 ENCOUNTER — Encounter (HOSPITAL_COMMUNITY): Payer: Self-pay

## 2017-05-14 ENCOUNTER — Encounter (HOSPITAL_COMMUNITY)
Admission: RE | Admit: 2017-05-14 | Discharge: 2017-05-14 | Disposition: A | Discharge status: Home / Self Care | Payer: Medicare Other | Source: Ambulatory Visit | Attending: Obstetrics & Gynecology | Admitting: Obstetrics & Gynecology

## 2017-05-14 DIAGNOSIS — I4510 Unspecified right bundle-branch block: Secondary | ICD-10-CM | POA: Insufficient documentation

## 2017-05-14 DIAGNOSIS — Z01812 Encounter for preprocedural laboratory examination: Secondary | ICD-10-CM | POA: Insufficient documentation

## 2017-05-14 DIAGNOSIS — N950 Postmenopausal bleeding: Secondary | ICD-10-CM | POA: Diagnosis not present

## 2017-05-14 DIAGNOSIS — Z833 Family history of diabetes mellitus: Secondary | ICD-10-CM | POA: Insufficient documentation

## 2017-05-14 DIAGNOSIS — Z7984 Long term (current) use of oral hypoglycemic drugs: Secondary | ICD-10-CM | POA: Diagnosis not present

## 2017-05-14 DIAGNOSIS — Z888 Allergy status to other drugs, medicaments and biological substances status: Secondary | ICD-10-CM | POA: Insufficient documentation

## 2017-05-14 DIAGNOSIS — N840 Polyp of corpus uteri: Secondary | ICD-10-CM | POA: Insufficient documentation

## 2017-05-14 DIAGNOSIS — Z79899 Other long term (current) drug therapy: Secondary | ICD-10-CM | POA: Insufficient documentation

## 2017-05-14 DIAGNOSIS — Z8249 Family history of ischemic heart disease and other diseases of the circulatory system: Secondary | ICD-10-CM | POA: Insufficient documentation

## 2017-05-14 DIAGNOSIS — Z01810 Encounter for preprocedural cardiovascular examination: Secondary | ICD-10-CM | POA: Diagnosis present

## 2017-05-14 DIAGNOSIS — Z881 Allergy status to other antibiotic agents status: Secondary | ICD-10-CM | POA: Insufficient documentation

## 2017-05-14 DIAGNOSIS — Z880 Allergy status to penicillin: Secondary | ICD-10-CM | POA: Diagnosis not present

## 2017-05-14 HISTORY — DX: Mononeuropathy, unspecified: G58.9

## 2017-05-14 HISTORY — DX: Other seasonal allergic rhinitis: J30.2

## 2017-05-14 HISTORY — DX: Injury of unspecified nerves of neck, initial encounter: S14.9XXA

## 2017-05-14 LAB — CBC
HEMATOCRIT: 39.5 % (ref 36.0–46.0)
HEMOGLOBIN: 13.1 g/dL (ref 12.0–15.0)
MCH: 26.3 pg (ref 26.0–34.0)
MCHC: 33.2 g/dL (ref 30.0–36.0)
MCV: 79.3 fL (ref 78.0–100.0)
Platelets: 223 10*3/uL (ref 150–400)
RBC: 4.98 MIL/uL (ref 3.87–5.11)
RDW: 14.5 % (ref 11.5–15.5)
WBC: 4.9 10*3/uL (ref 4.0–10.5)

## 2017-05-14 LAB — BASIC METABOLIC PANEL
ANION GAP: 10 (ref 5–15)
BUN: 10 mg/dL (ref 6–20)
CHLORIDE: 98 mmol/L — AB (ref 101–111)
CO2: 27 mmol/L (ref 22–32)
Calcium: 9 mg/dL (ref 8.9–10.3)
Creatinine, Ser: 0.82 mg/dL (ref 0.44–1.00)
GFR calc non Af Amer: >60 mL/min (ref >60)
Glucose, Bld: 239 mg/dL — ABNORMAL HIGH (ref 65–99)
POTASSIUM: 3.8 mmol/L (ref 3.5–5.1)
SODIUM: 135 mmol/L (ref 135–145)

## 2017-05-14 NOTE — Pre-Procedure Instructions (Signed)
Dr. Sandford Craze. Jackson reviewed EKG no new orders received, just continue to taking blood pressure medication per normal routine.

## 2017-05-20 ENCOUNTER — Ambulatory Visit (HOSPITAL_COMMUNITY): Payer: Medicare Other | Admitting: Registered Nurse

## 2017-05-20 ENCOUNTER — Encounter (HOSPITAL_COMMUNITY): Payer: Self-pay

## 2017-05-20 ENCOUNTER — Ambulatory Visit (HOSPITAL_COMMUNITY)
Admission: RE | Admit: 2017-05-20 | Discharge: 2017-05-20 | Disposition: A | Discharge status: Home / Self Care | Payer: Medicare Other | Source: Ambulatory Visit | Attending: Obstetrics & Gynecology | Admitting: Obstetrics & Gynecology

## 2017-05-20 ENCOUNTER — Encounter (HOSPITAL_COMMUNITY)
Admission: RE | Disposition: A | Discharge status: Home / Self Care | Payer: Self-pay | Source: Ambulatory Visit | Attending: Obstetrics & Gynecology

## 2017-05-20 DIAGNOSIS — E559 Vitamin D deficiency, unspecified: Secondary | ICD-10-CM | POA: Insufficient documentation

## 2017-05-20 DIAGNOSIS — N840 Polyp of corpus uteri: Secondary | ICD-10-CM | POA: Diagnosis not present

## 2017-05-20 DIAGNOSIS — Z87891 Personal history of nicotine dependence: Secondary | ICD-10-CM | POA: Insufficient documentation

## 2017-05-20 DIAGNOSIS — I129 Hypertensive chronic kidney disease with stage 1 through stage 4 chronic kidney disease, or unspecified chronic kidney disease: Secondary | ICD-10-CM | POA: Insufficient documentation

## 2017-05-20 DIAGNOSIS — E7800 Pure hypercholesterolemia, unspecified: Secondary | ICD-10-CM | POA: Insufficient documentation

## 2017-05-20 DIAGNOSIS — Z880 Allergy status to penicillin: Secondary | ICD-10-CM | POA: Insufficient documentation

## 2017-05-20 DIAGNOSIS — Z79899 Other long term (current) drug therapy: Secondary | ICD-10-CM | POA: Diagnosis not present

## 2017-05-20 DIAGNOSIS — E1165 Type 2 diabetes mellitus with hyperglycemia: Secondary | ICD-10-CM | POA: Diagnosis not present

## 2017-05-20 DIAGNOSIS — Z7984 Long term (current) use of oral hypoglycemic drugs: Secondary | ICD-10-CM | POA: Diagnosis not present

## 2017-05-20 DIAGNOSIS — E1122 Type 2 diabetes mellitus with diabetic chronic kidney disease: Secondary | ICD-10-CM | POA: Diagnosis not present

## 2017-05-20 DIAGNOSIS — Z833 Family history of diabetes mellitus: Secondary | ICD-10-CM | POA: Insufficient documentation

## 2017-05-20 DIAGNOSIS — N182 Chronic kidney disease, stage 2 (mild): Secondary | ICD-10-CM | POA: Diagnosis not present

## 2017-05-20 HISTORY — PX: DILATATION & CURETTAGE/HYSTEROSCOPY WITH MYOSURE: SHX6511

## 2017-05-20 LAB — GLUCOSE, CAPILLARY: Glucose-Capillary: 145 mg/dL — ABNORMAL HIGH (ref 65–99)

## 2017-05-20 SURGERY — DILATATION & CURETTAGE/HYSTEROSCOPY WITH MYOSURE
Anesthesia: General | Site: Vagina

## 2017-05-20 MED ORDER — KETOROLAC TROMETHAMINE 30 MG/ML IJ SOLN
INTRAMUSCULAR | Status: DC | PRN
  Administered 2017-05-20: 30 mg via INTRAVENOUS

## 2017-05-20 MED ORDER — FENTANYL CITRATE (PF) 100 MCG/2ML IJ SOLN
INTRAMUSCULAR | Status: AC
  Filled 2017-05-20: qty 2

## 2017-05-20 MED ORDER — ONDANSETRON HCL 4 MG/2ML IJ SOLN
INTRAMUSCULAR | Status: AC
  Filled 2017-05-20: qty 2

## 2017-05-20 MED ORDER — LIDOCAINE HCL (CARDIAC) 20 MG/ML IV SOLN
INTRAVENOUS | Status: AC
  Filled 2017-05-20: qty 5

## 2017-05-20 MED ORDER — SODIUM CHLORIDE 0.9 % IR SOLN
Status: DC | PRN
  Administered 2017-05-20: 3000 mL

## 2017-05-20 MED ORDER — KETOROLAC TROMETHAMINE 30 MG/ML IJ SOLN
INTRAMUSCULAR | Status: AC
  Filled 2017-05-20: qty 1

## 2017-05-20 MED ORDER — METOCLOPRAMIDE HCL 5 MG/ML IJ SOLN: 10.0000 mg | Freq: Once | INTRAMUSCULAR | Status: DC | PRN

## 2017-05-20 MED ORDER — SUCCINYLCHOLINE CHLORIDE 200 MG/10ML IV SOSY
PREFILLED_SYRINGE | INTRAVENOUS | Status: AC
  Filled 2017-05-20: qty 20

## 2017-05-20 MED ORDER — PHENYLEPHRINE 40 MCG/ML (10ML) SYRINGE FOR IV PUSH (FOR BLOOD PRESSURE SUPPORT)
PREFILLED_SYRINGE | INTRAVENOUS | Status: AC
  Filled 2017-05-20: qty 10

## 2017-05-20 MED ORDER — LACTATED RINGERS IV SOLN
INTRAVENOUS | Status: DC
  Administered 2017-05-20: 07:00:00 via INTRAVENOUS

## 2017-05-20 MED ORDER — FENTANYL CITRATE (PF) 100 MCG/2ML IJ SOLN
INTRAMUSCULAR | Status: DC | PRN
  Administered 2017-05-20: 50 ug via INTRAVENOUS
  Administered 2017-05-20 (×2): 25 ug via INTRAVENOUS

## 2017-05-20 MED ORDER — LIDOCAINE-EPINEPHRINE 1 %-1:100000 IJ SOLN
INTRAMUSCULAR | Status: AC
  Filled 2017-05-20: qty 1

## 2017-05-20 MED ORDER — FENTANYL CITRATE (PF) 100 MCG/2ML IJ SOLN: 25.0000 ug | INTRAMUSCULAR | Status: DC | PRN

## 2017-05-20 MED ORDER — LIDOCAINE 2% (20 MG/ML) 5 ML SYRINGE
INTRAMUSCULAR | Status: DC | PRN
  Administered 2017-05-20: 60 mg via INTRAVENOUS

## 2017-05-20 MED ORDER — DEXAMETHASONE SODIUM PHOSPHATE 10 MG/ML IJ SOLN
INTRAMUSCULAR | Status: AC
  Filled 2017-05-20: qty 1

## 2017-05-20 MED ORDER — MEPERIDINE HCL 25 MG/ML IJ SOLN: 6.2500 mg | INTRAMUSCULAR | Status: DC | PRN

## 2017-05-20 MED ORDER — PROPOFOL 10 MG/ML IV BOLUS
INTRAVENOUS | Status: DC | PRN
  Administered 2017-05-20: 50 mg via INTRAVENOUS
  Administered 2017-05-20: 150 mg via INTRAVENOUS

## 2017-05-20 MED ORDER — LIDOCAINE-EPINEPHRINE 1 %-1:100000 IJ SOLN
INTRAMUSCULAR | Status: DC | PRN
  Administered 2017-05-20: 10 mL

## 2017-05-20 MED ORDER — ONDANSETRON HCL 4 MG/2ML IJ SOLN
INTRAMUSCULAR | Status: DC | PRN
  Administered 2017-05-20: 4 mg via INTRAVENOUS

## 2017-05-20 MED ORDER — PROPOFOL 10 MG/ML IV BOLUS
INTRAVENOUS | Status: AC
  Filled 2017-05-20: qty 20

## 2017-05-20 MED ORDER — DEXAMETHASONE SODIUM PHOSPHATE 10 MG/ML IJ SOLN
INTRAMUSCULAR | Status: DC | PRN
  Administered 2017-05-20: 10 mg via INTRAVENOUS

## 2017-05-20 SURGICAL SUPPLY — 19 items
CANISTER SUCT 3000ML PPV (MISCELLANEOUS) ×3 IMPLANT
CATH ROBINSON RED A/P 16FR (CATHETERS) ×3 IMPLANT
CLOTH BEACON ORANGE TIMEOUT ST (SAFETY) ×3 IMPLANT
CONTAINER PREFILL 10% NBF 60ML (FORM) ×6 IMPLANT
DEVICE MYOSURE LITE (MISCELLANEOUS) ×2 IMPLANT
DEVICE MYOSURE REACH (MISCELLANEOUS) IMPLANT
DILATOR CANAL MILEX (MISCELLANEOUS) ×2 IMPLANT
GLOVE BIOGEL PI IND STRL 6.5 (GLOVE) ×1 IMPLANT
GLOVE BIOGEL PI IND STRL 7.0 (GLOVE) ×1 IMPLANT
GLOVE BIOGEL PI INDICATOR 6.5 (GLOVE) ×2
GLOVE BIOGEL PI INDICATOR 7.0 (GLOVE) ×2
GLOVE ECLIPSE 6.5 STRL STRAW (GLOVE) ×3 IMPLANT
GOWN STRL REUS W/TWL LRG LVL3 (GOWN DISPOSABLE) ×6 IMPLANT
PACK VAGINAL MINOR WOMEN LF (CUSTOM PROCEDURE TRAY) ×3 IMPLANT
PAD OB MATERNITY 4.3X12.25 (PERSONAL CARE ITEMS) ×3 IMPLANT
SEAL ROD LENS SCOPE MYOSURE (ABLATOR) ×3 IMPLANT
TOWEL OR 17X24 6PK STRL BLUE (TOWEL DISPOSABLE) ×6 IMPLANT
TUBING AQUILEX INFLOW (TUBING) ×3 IMPLANT
TUBING AQUILEX OUTFLOW (TUBING) ×3 IMPLANT

## 2017-05-20 NOTE — Discharge Instructions (Addendum)
°  Post Anesthesia Home Care Instructions  Activity: Get plenty of rest for the remainder of the day. A responsible individual must stay with you for 24 hours following the procedure.  For the next 24 hours, DO NOT: -Drive a car -Advertising copywriterperate machinery -Drink alcoholic beverages -Take any medication unless instructed by your physician -Make any legal decisions or sign important papers.  Meals: Start with liquid foods such as gelatin or soup. Progress to regular foods as tolerated. Avoid greasy, spicy, heavy foods. If nausea and/or vomiting occur, drink only clear liquids until the nausea and/or vomiting subsides. Call your physician if vomiting continues.  Special Instructions/Symptoms: Your throat may feel dry or sore from the anesthesia or the breathing tube placed in your throat during surgery. If this causes discomfort, gargle with warm salt water. The discomfort should disappear within 24 hours.  If you had a scopolamine patch placed behind your ear for the management of post- operative nausea and/or vomiting:  1. The medication in the patch is effective for 72 hours, after which it should be removed.  Wrap patch in a tissue and discard in the trash. Wash hands thoroughly with soap and water. 2. You may remove the patch earlier than 72 hours if you experience unpleasant side effects which may include dry mouth, dizziness or visual disturbances. 3. Avoid touching the patch. Wash your hands with soap and water after contact with the patch.   HOME INSTRUCTIONS  Please note any unusual or excessive bleeding, pain, swelling. Mild dizziness or drowsiness are normal for about 24 hours after surgery.   Shower when comfortable  Restrictions: No driving for 24 hours or while taking pain medications.  Activity:  No heavy lifting (> 10 lbs), nothing in vagina (no tampons, douching, or intercourse) x 2 weeks; no tub baths for 2 weeks Vaginal spotting is expected but if your bleeding is heavy,  period like,  please call the office  Diet:  You may return to your regular diet.  Do not eat large meals.  Eat small frequent meals throughout the day.  Continue to drink a good amount of water at least 6-8 glasses of water per day, hydration is very important for the healing process.  Pain Management: Take over the counter tylenol or ibuprofen as needed for pain.  Always take prescription pain medication with food, it may cause constipation, increase fluids and fiber and you may want to take an over-the-counter stool softener like Colace as needed up to 2x a day.    Alcohol -- Avoid for 24 hours and while taking pain medications.  Nausea: Take sips of ginger ale or soda  Fever -- Call physician if temperature over 101 degrees  Follow up:  If you do not already have a follow up appointment scheduled, please call the office at [phone].  If you experience fever (a temperature greater than 100.4), pain unrelieved by pain medication, shortness of breath, swelling of a single leg, or any other symptoms which are concerning to you please the office immediately.

## 2017-05-20 NOTE — Anesthesia Procedure Notes (Signed)
Procedure Name: LMA Insertion Date/Time: 05/20/2017 7:16 AM Performed by: Jhonnie GarnerMARSHALL, Addison Whidbee M Pre-anesthesia Checklist: Patient identified, Emergency Drugs available, Suction available and Patient being monitored Patient Re-evaluated:Patient Re-evaluated prior to induction Oxygen Delivery Method: Circle system utilized Preoxygenation: Pre-oxygenation with 100% oxygen Induction Type: IV induction Ventilation: Mask ventilation without difficulty LMA: LMA inserted LMA Size: 4.0 Number of attempts: 1 Placement Confirmation: positive ETCO2 and breath sounds checked- equal and bilateral Tube secured with: Tape Dental Injury: Teeth and Oropharynx as per pre-operative assessment

## 2017-05-20 NOTE — Op Note (Addendum)
Operative Report  PreOp: Hysteroscopy, D&C, myosure polypectomy PostOp: same Procedure:  Hysteroscopy, Dilation and Curettage, Polypectomy Surgeon: Dr. Myna HidalgoJennifer Berthel Rodriguez Anesthesia: General Complications:none EBL: Minimal (15cc) UOP: 50cc IVF:700cc Discrepancy: 800cc   Findings:6cm anteverted uterus, 1.5cm elongated polyp noted on posterior wall and thickening of endometrium noted near right ostia , difficulty visualizing right ostia due to polyp, left ostia visualized  Specimens: 1) endometrial curettings with polyp  Procedure: The patient was taken to the operating room where she underwent general anesthesia without difficulty. The patient was placed in a low lithotomy position using Allen stirrups. The patient was examined with the findings as noted above.  She was then prepped and draped in the normal sterile fashion. The bladder was drained using a red rubber urethral catheter. A sterile speculum was inserted into the vagina. 10cc of Lidocaine with epinephrine was used for a cervical block.  A single tooth tenaculum was placed on the anterior lip of the cervix. The uterus was then sounded to 6cm. The endocervical canal was then serially dilated using os dilator and Hank dilators.  The diagnostic hysteroscope was then inserted without difficulty and noted to have the findings as listed above. Visualization was achieved using NS as a distending medium. The myosure was then used for resection of the polyps.  The hysteroscope was removed and sharp curettage was performed. The tissue was sent to pathology. The hysteroscope was reinserted, fundus was visualized, no evidence of uterine perforation appreciated. All instrument were then removed. Hemostasis was observed at the cervical site.  The patient was repositioned to the supine position. The patient tolerated the procedure without any complications and taken to recovery in stable condition.   Barbara HidalgoJennifer Nyasha Rahilly, DO [phone] (pager) [phone]  (office)

## 2017-05-20 NOTE — Anesthesia Postprocedure Evaluation (Signed)
Anesthesia Post Note  Patient: Barbara Rodriguez  Procedure(s) Performed: Procedure(s) (LRB): DILATATION & CURETTAGE/HYSTEROSCOPY WITH MYOSURE (N/A)     Patient location during evaluation: PACU Anesthesia Type: General Level of consciousness: awake and alert and oriented Pain management: pain level controlled Vital Signs Assessment: post-procedure vital signs reviewed and stable Respiratory status: spontaneous breathing, nonlabored ventilation and respiratory function stable Cardiovascular status: blood pressure returned to baseline and stable Postop Assessment: no signs of nausea or vomiting Anesthetic complications: no    Last Vitals:  Vitals:   05/20/17 0800 05/20/17 0815  BP: (!) 178/65 (!) 165/61  Pulse: 70 63  Resp: 15 11  Temp:    SpO2: 100% 95%    Last Pain:  Vitals:   05/20/17 0815  TempSrc:   PainSc: 2    Pain Goal: Patients Stated Pain Goal: 4 (05/20/17 0604)               Johnthan Axtman A.

## 2017-05-20 NOTE — Transfer of Care (Signed)
Immediate Anesthesia Transfer of Care Note  Patient: Barbara Rodriguez  Procedure(s) Performed: Procedure(s) with comments: DILATATION & CURETTAGE/HYSTEROSCOPY WITH MYOSURE (N/A) - Polypectomy  Patient Location: PACU  Anesthesia Type:General  Level of Consciousness:  sedated, patient cooperative and responds to stimulation  Airway & Oxygen Therapy:Patient Spontanous Breathing and Patient connected to face mask oxgen  Post-op Assessment:  Report given to PACU RN and Post -op Vital signs reviewed and stable  Post vital signs:  Reviewed and stable  Last Vitals:  Vitals:   05/20/17 0604  BP: (!) 195/67  Pulse: 69  Resp: 16  Temp: 36.6 C  SpO2: 100%    Complications: No apparent anesthesia complications

## 2017-05-20 NOTE — Anesthesia Preprocedure Evaluation (Addendum)
Anesthesia Evaluation  Patient identified by MRN, date of birth, ID band Patient awake    Reviewed: Allergy & Precautions, NPO status , Patient's Chart, lab work & pertinent test results  Airway Mallampati: III  TM Distance: >3 FB     Dental no notable dental hx. (+) Teeth Intact   Pulmonary former smoker,    Pulmonary exam normal breath sounds clear to auscultation       Cardiovascular hypertension, Pt. on medications Normal cardiovascular exam Rhythm:Regular Rate:Normal     Neuro/Psych negative neurological ROS  negative psych ROS   GI/Hepatic negative GI ROS, Neg liver ROS,   Endo/Other  diabetes, Poorly Controlled, Type 2, Oral Hypoglycemic Agents  Renal/GU negative Renal ROS  negative genitourinary   Musculoskeletal " pinched nerve in neck "   Abdominal   Peds  Hematology negative hematology ROS (+)   Anesthesia Other Findings   Reproductive/Obstetrics Endometrial mass                           Anesthesia Physical Anesthesia Plan  ASA: II  Anesthesia Plan: General   Post-op Pain Management:    Induction: Intravenous  PONV Risk Score and Plan: 4 or greater and Ondansetron, Dexamethasone, Midazolam, Propofol infusion and Metaclopromide  Airway Management Planned: LMA  Additional Equipment:   Intra-op Plan:   Post-operative Plan: Extubation in OR  Informed Consent: I have reviewed the patients History and Physical, chart, labs and discussed the procedure including the risks, benefits and alternatives for the proposed anesthesia with the patient or authorized representative who has indicated his/her understanding and acceptance.   Dental advisory given  Plan Discussed with: CRNA, Anesthesiologist and Surgeon  Anesthesia Plan Comments:         Anesthesia Quick Evaluation

## 2017-05-20 NOTE — Interval H&P Note (Signed)
History and Physical Interval Note:  05/20/2017 6:31 AM  Barbara Rodriguez  has presented today for surgery, with the diagnosis of N85.9 Uterine Mass  The various methods of treatment have been discussed with the patient and family. After consideration of risks, benefits and other options for treatment, the patient has consented to  Procedure(s) with comments: DILATATION & CURETTAGE/HYSTEROSCOPY WITH MYOSURE (N/A) - Polypectomy as a surgical intervention .  The patient's history has been reviewed, patient examined, no change in status, stable for surgery.  I have reviewed the patient's chart and labs.  Questions were answered to the patient's satisfaction.     Myna HidalgoZAN, Brehanna Deveny, M

## 2017-05-21 ENCOUNTER — Encounter (HOSPITAL_COMMUNITY): Payer: Self-pay | Admitting: Obstetrics & Gynecology

## 2018-06-27 IMAGING — CR DG CERVICAL SPINE COMPLETE 4+V
7 series · 7 of 7 positions shown · non-contrast
Comparison: None.

CLINICAL DATA: Fall from chair 2 weeks ago with neck pain, initial
encounter

EXAM:
CERVICAL SPINE - COMPLETE 4+ VIEW

[w cervical spine lat]
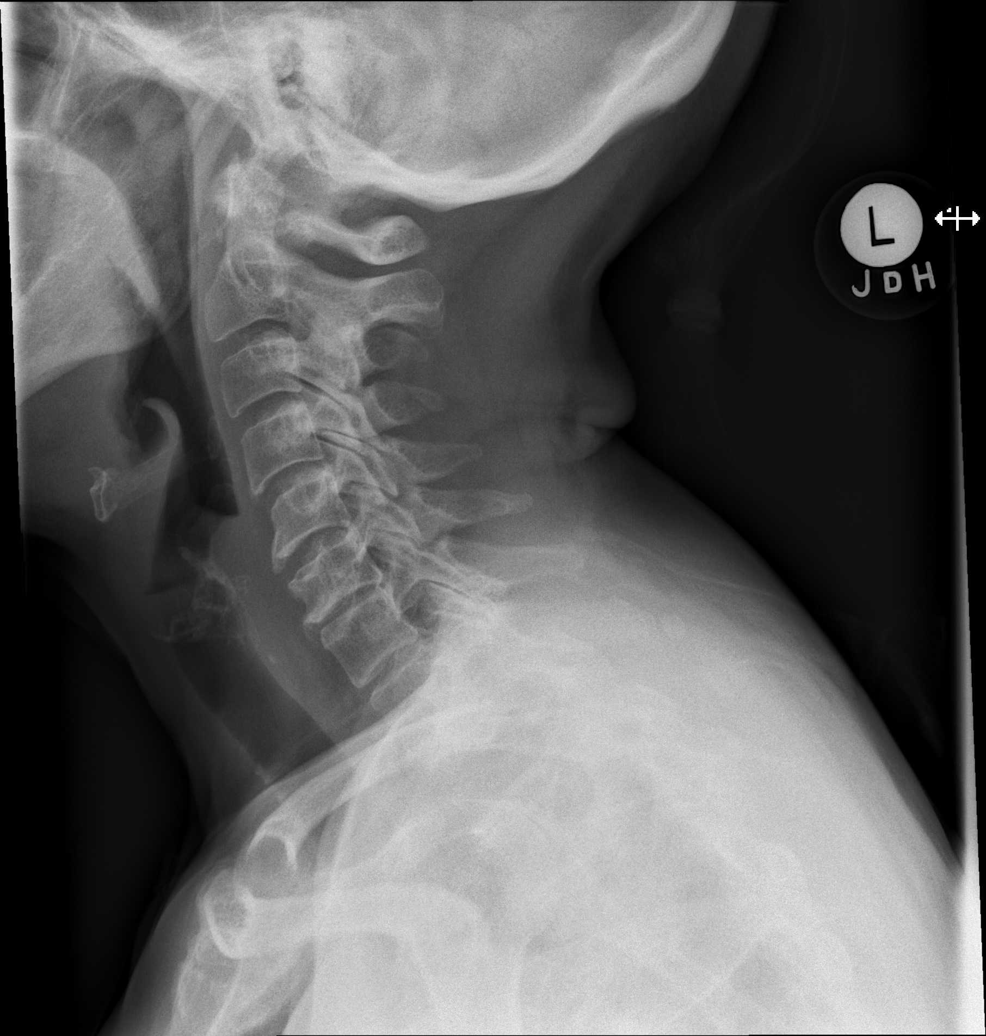

[w cervical spine ap_obl (1 of 2)]
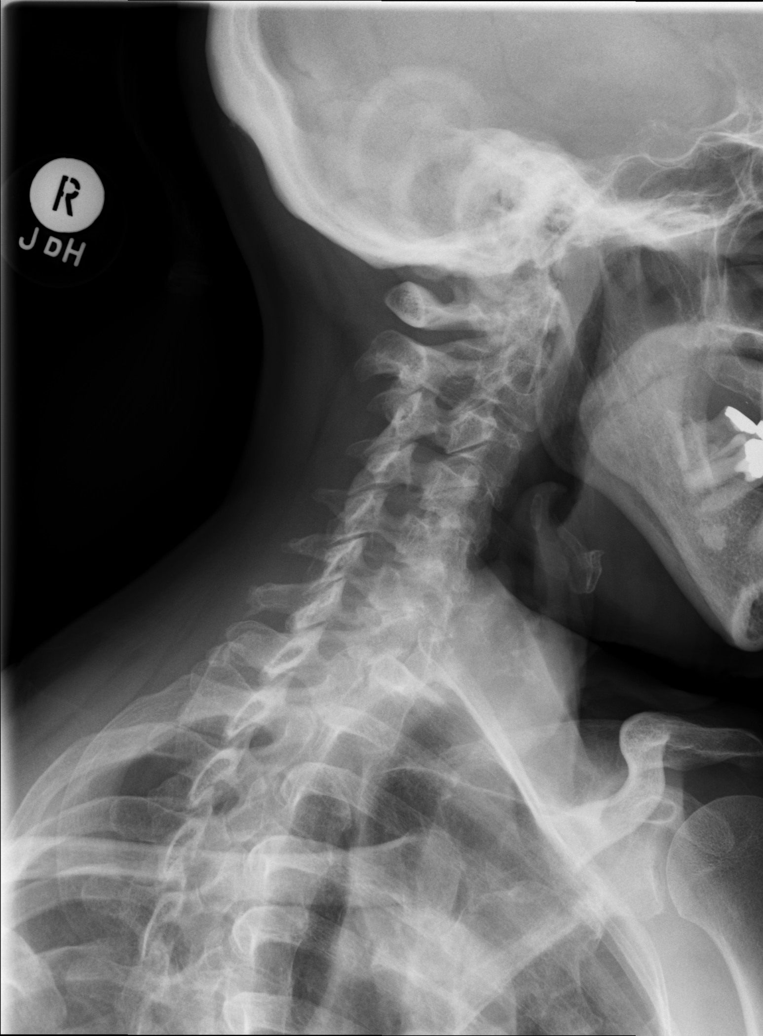

[w cervical spine ap_obl (2 of 2)]
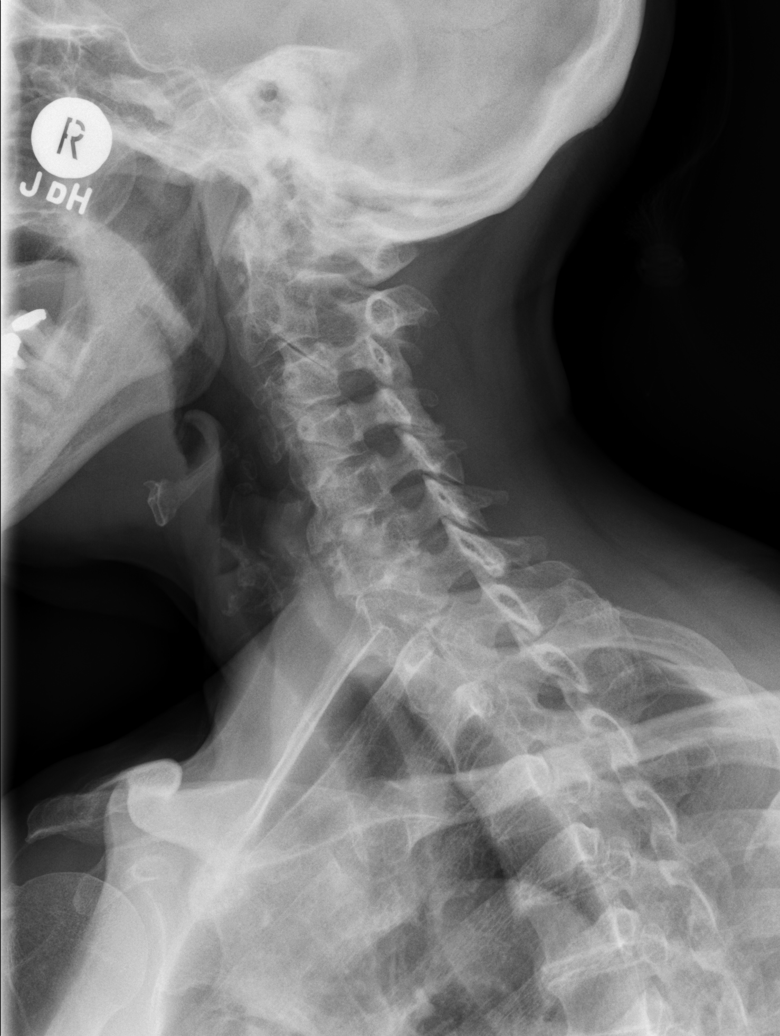

[w cervical spine ap]
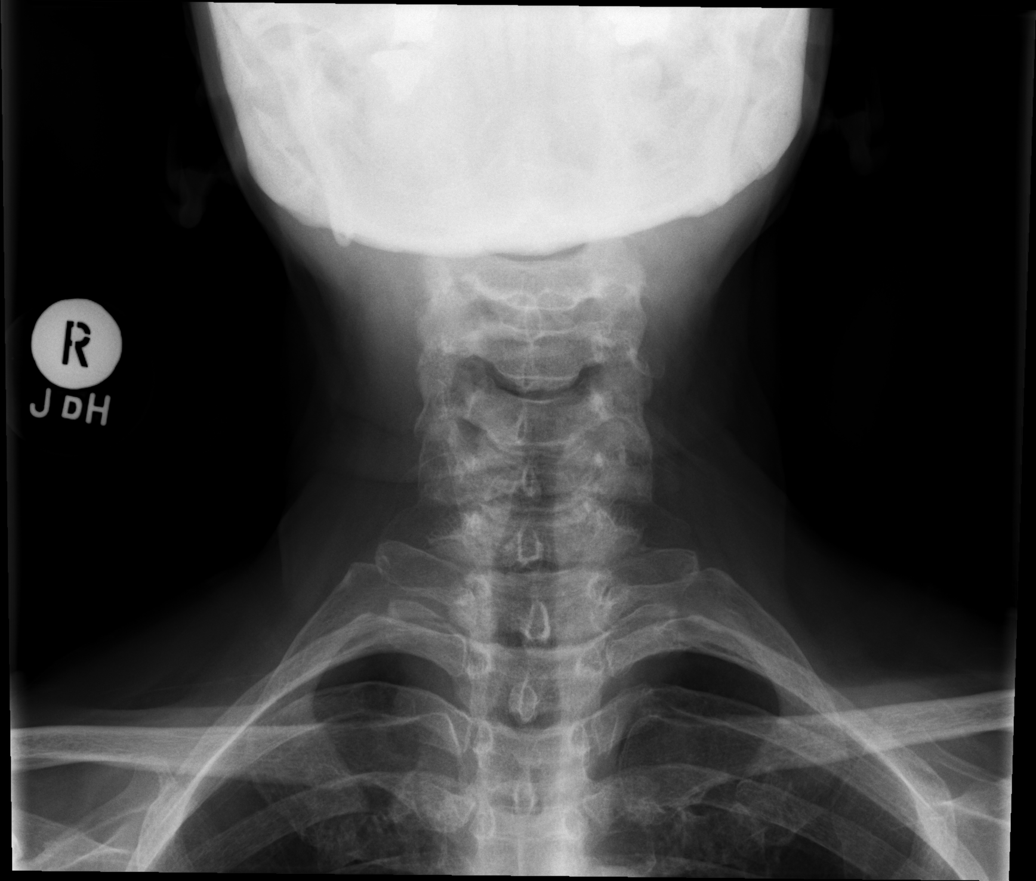

[t cervical spine odontoid (1 of 3)]
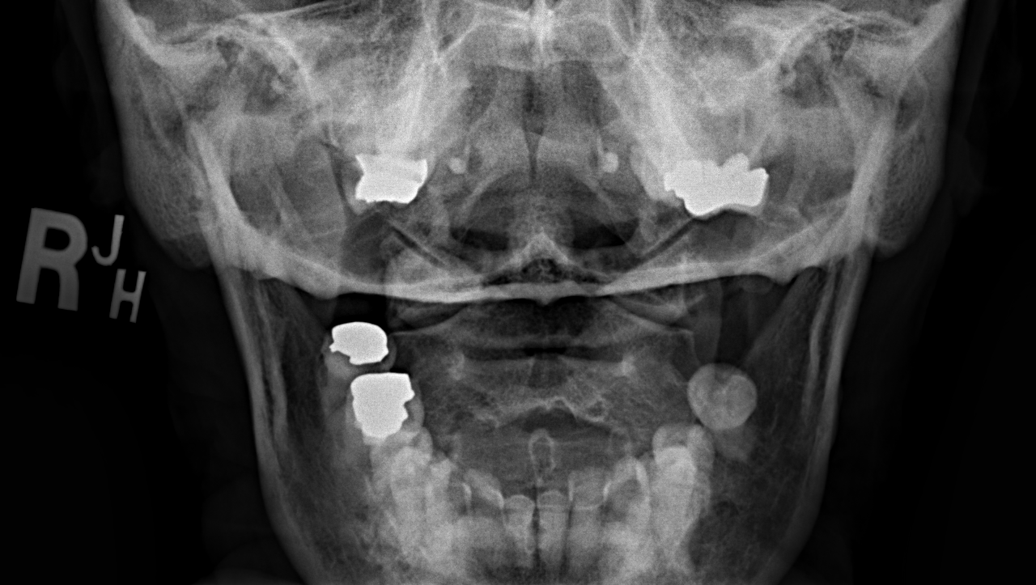

[t cervical spine odontoid (2 of 3)]
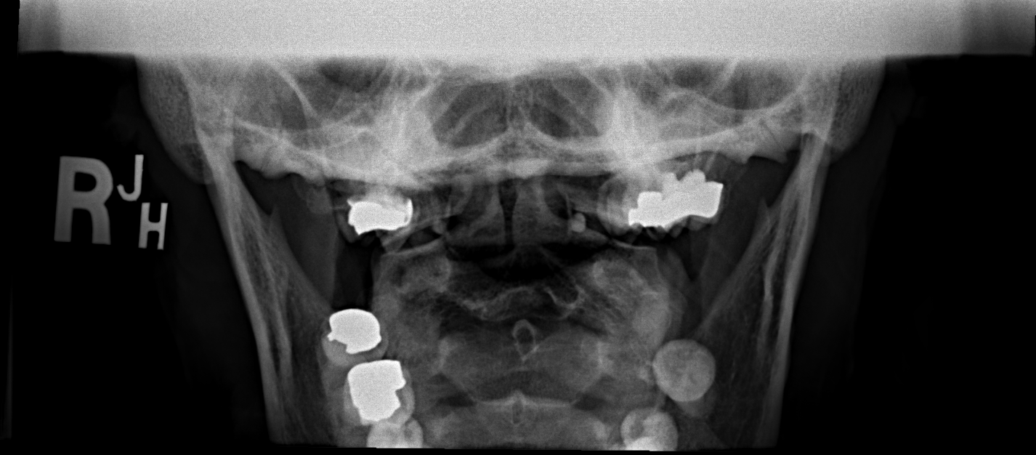

[t cervical spine odontoid (3 of 3)]
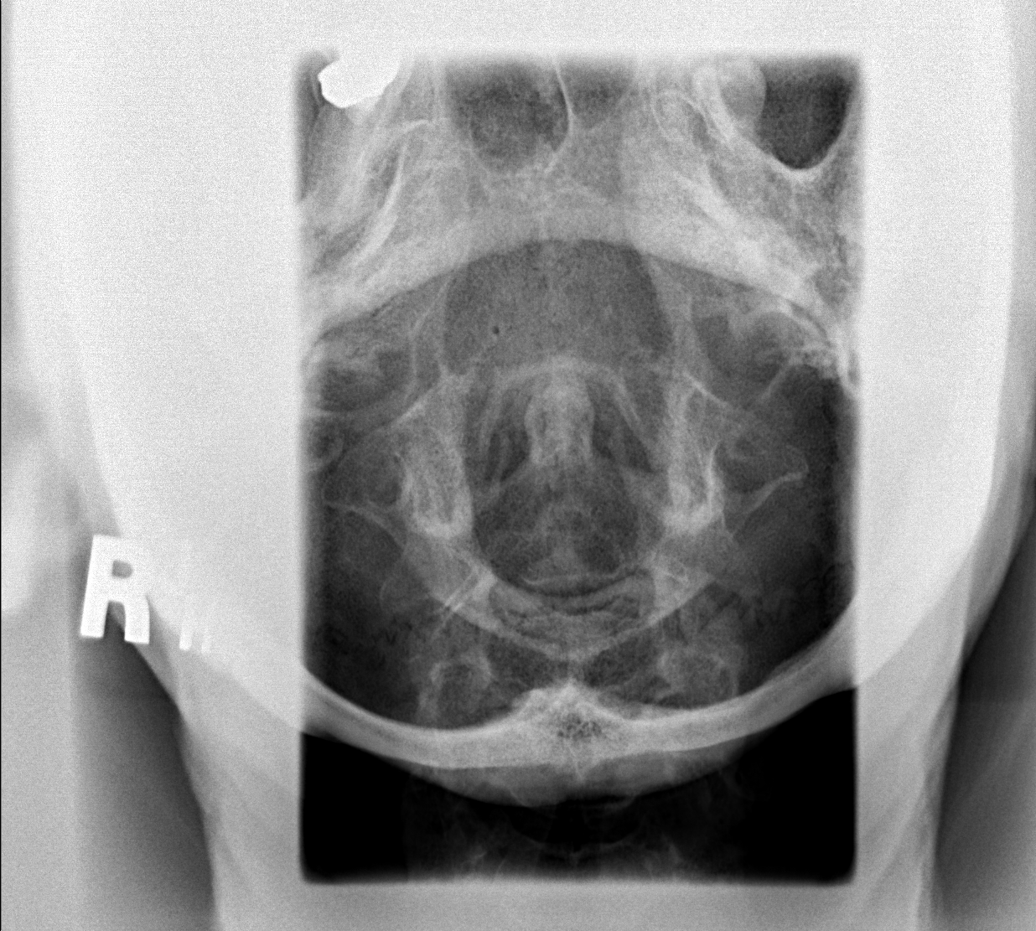

[7 of 7 positions shown; findings below may reference images not displayed]

FINDINGS: Seven cervical segments are well visualized. Vertebral body height
is well maintained. Osteophytic changes are noted at C4-5, C5-6 and
C6-7. The neural foramina are patent. Mild facet hypertrophic
changes are noted. The odontoid is within normal limits. No acute
fracture is seen. No soft tissue changes are noted.
IMPRESSION: Mild degenerative change without acute abnormality.

## 2019-01-11 ENCOUNTER — Emergency Department (HOSPITAL_COMMUNITY): Payer: Medicare Other

## 2019-01-11 ENCOUNTER — Emergency Department (HOSPITAL_COMMUNITY)
Admission: EM | Admit: 2019-01-11 | Discharge: 2019-01-11 | Disposition: A | Discharge status: Home / Self Care | Payer: Medicare Other | Attending: Emergency Medicine | Admitting: Emergency Medicine

## 2019-01-11 ENCOUNTER — Encounter (HOSPITAL_COMMUNITY): Payer: Self-pay | Admitting: *Deleted

## 2019-01-11 ENCOUNTER — Other Ambulatory Visit: Payer: Self-pay

## 2019-01-11 DIAGNOSIS — Z7984 Long term (current) use of oral hypoglycemic drugs: Secondary | ICD-10-CM | POA: Insufficient documentation

## 2019-01-11 DIAGNOSIS — E119 Type 2 diabetes mellitus without complications: Secondary | ICD-10-CM | POA: Insufficient documentation

## 2019-01-11 DIAGNOSIS — M25512 Pain in left shoulder: Secondary | ICD-10-CM | POA: Diagnosis present

## 2019-01-11 DIAGNOSIS — I10 Essential (primary) hypertension: Secondary | ICD-10-CM | POA: Diagnosis not present

## 2019-01-11 DIAGNOSIS — Z87891 Personal history of nicotine dependence: Secondary | ICD-10-CM | POA: Diagnosis not present

## 2019-01-11 DIAGNOSIS — R0789 Other chest pain: Secondary | ICD-10-CM | POA: Insufficient documentation

## 2019-01-11 DIAGNOSIS — R51 Headache: Secondary | ICD-10-CM | POA: Insufficient documentation

## 2019-01-11 DIAGNOSIS — M546 Pain in thoracic spine: Secondary | ICD-10-CM

## 2019-01-11 LAB — COMPREHENSIVE METABOLIC PANEL
ALT: 13 U/L (ref 0–44)
AST: 20 U/L (ref 15–41)
Albumin: 3.7 g/dL (ref 3.5–5.0)
Alkaline Phosphatase: 77 U/L (ref 38–126)
Anion gap: 9 (ref 5–15)
BUN: 8 mg/dL (ref 8–23)
CO2: 26 mmol/L (ref 22–32)
Calcium: 9.2 mg/dL (ref 8.9–10.3)
Chloride: 105 mmol/L (ref 98–111)
Creatinine, Ser: 0.8 mg/dL (ref 0.44–1.00)
GFR calc Af Amer: >60 mL/min (ref >60)
GFR calc non Af Amer: >60 mL/min (ref >60)
Glucose, Bld: 143 mg/dL — ABNORMAL HIGH (ref 70–99)
Potassium: 4.2 mmol/L (ref 3.5–5.1)
Sodium: 140 mmol/L (ref 135–145)
Total Bilirubin: 0.7 mg/dL (ref 0.3–1.2)
Total Protein: 6.5 g/dL (ref 6.5–8.1)

## 2019-01-11 LAB — CBC WITH DIFFERENTIAL/PLATELET
Abs Immature Granulocytes: 0.02 10*3/uL (ref 0.00–0.07)
Basophils Absolute: 0 10*3/uL (ref 0.0–0.1)
Basophils Relative: 0 %
Eosinophils Absolute: 0 10*3/uL (ref 0.0–0.5)
Eosinophils Relative: 1 %
HCT: 39.3 % (ref 36.0–46.0)
Hemoglobin: 12.6 g/dL (ref 12.0–15.0)
Immature Granulocytes: 0 %
Lymphocytes Relative: 37 %
Lymphs Abs: 2.1 10*3/uL (ref 0.7–4.0)
MCH: 26.2 pg (ref 26.0–34.0)
MCHC: 32.1 g/dL (ref 30.0–36.0)
MCV: 81.7 fL (ref 80.0–100.0)
Monocytes Absolute: 0.4 10*3/uL (ref 0.1–1.0)
Monocytes Relative: 8 %
Neutro Abs: 3 10*3/uL (ref 1.7–7.7)
Neutrophils Relative %: 54 %
Platelets: 243 10*3/uL (ref 150–400)
RBC: 4.81 MIL/uL (ref 3.87–5.11)
RDW: 14.2 % (ref 11.5–15.5)
WBC: 5.6 10*3/uL (ref 4.0–10.5)
nRBC: 0 % (ref 0.0–0.2)

## 2019-01-11 LAB — TROPONIN I: Troponin I: <0.03 ng/mL (ref <0.03)

## 2019-01-11 MED ORDER — IOHEXOL 350 MG/ML SOLN
100.0000 mL | Freq: Once | INTRAVENOUS | Status: AC | PRN
  Administered 2019-01-11: 100 mL via INTRAVENOUS

## 2019-01-11 NOTE — ED Provider Notes (Addendum)
Patient transferred to yellow zone, care assumed from Dr. Madilyn Hookees.  See her note for full HPI and work-up.  Briefly, patient presenting with chief complaint of left-sided back pain, however gives vague history of symptoms.  Labs and imaging obtained today.  Labs are within normal limits.  Pending CTA of the chest abdomen pelvis, and CT head.  Patient is asymptomatic here. If neg imaging, d/c home wtih PCP follow up. Physical Exam  BP (!) 169/63   Pulse 62   Temp 97.8 F (36.6 C) (Oral)   Resp 14   SpO2 99%   Physical Exam Vitals signs and nursing note reviewed.  Constitutional:      General: She is not in acute distress.    Appearance: She is well-developed.     Comments: Conversive, no distress  HENT:     Head: Normocephalic and atraumatic.  Eyes:     Conjunctiva/sclera: Conjunctivae normal.  Cardiovascular:     Rate and Rhythm: Normal rate.  Pulmonary:     Effort: Pulmonary effort is normal.  Abdominal:     Palpations: Abdomen is soft.  Skin:    General: Skin is warm.  Neurological:     Mental Status: She is alert.  Psychiatric:        Behavior: Behavior normal.    Results for orders placed or performed during the hospital encounter of 01/11/19  Comprehensive metabolic panel  Result Value Ref Range   Sodium 140 135 - 145 mmol/L   Potassium 4.2 3.5 - 5.1 mmol/L   Chloride 105 98 - 111 mmol/L   CO2 26 22 - 32 mmol/L   Glucose, Bld 143 (H) 70 - 99 mg/dL   BUN 8 8 - 23 mg/dL   Creatinine, Ser 6.960.80 0.44 - 1.00 mg/dL   Calcium 9.2 8.9 - 29.510.3 mg/dL   Total Protein 6.5 6.5 - 8.1 g/dL   Albumin 3.7 3.5 - 5.0 g/dL   AST 20 15 - 41 U/L   ALT 13 0 - 44 U/L   Alkaline Phosphatase 77 38 - 126 U/L   Total Bilirubin 0.7 0.3 - 1.2 mg/dL   GFR calc non Af Amer >60 >60 mL/min   GFR calc Af Amer >60 >60 mL/min   Anion gap 9 5 - 15  CBC with Differential  Result Value Ref Range   WBC 5.6 4.0 - 10.5 K/uL   RBC 4.81 3.87 - 5.11 MIL/uL   Hemoglobin 12.6 12.0 - 15.0 g/dL   HCT 28.439.3  13.236.0 - 44.046.0 %   MCV 81.7 80.0 - 100.0 fL   MCH 26.2 26.0 - 34.0 pg   MCHC 32.1 30.0 - 36.0 g/dL   RDW 10.214.2 72.511.5 - 36.615.5 %   Platelets 243 150 - 400 K/uL   nRBC 0.0 0.0 - 0.2 %   Neutrophils Relative % 54 %   Neutro Abs 3.0 1.7 - 7.7 K/uL   Lymphocytes Relative 37 %   Lymphs Abs 2.1 0.7 - 4.0 K/uL   Monocytes Relative 8 %   Monocytes Absolute 0.4 0.1 - 1.0 K/uL   Eosinophils Relative 1 %   Eosinophils Absolute 0.0 0.0 - 0.5 K/uL   Basophils Relative 0 %   Basophils Absolute 0.0 0.0 - 0.1 K/uL   Immature Granulocytes 0 %   Abs Immature Granulocytes 0.02 0.00 - 0.07 K/uL  Troponin I - Once  Result Value Ref Range   Troponin I <0.03 <0.03 ng/mL   Dg Chest 2 View  Result Date: 01/11/2019 CLINICAL  DATA:  78 year old female with left shoulder pain EXAM: CHEST - 2 VIEW COMPARISON:  Prior chest x-ray 12/25/2014 FINDINGS: The lungs are clear and negative for focal airspace consolidation, pulmonary edema or suspicious pulmonary nodule. No pleural effusion or pneumothorax. Cardiac and mediastinal contours are within normal limits. No acute fracture or lytic or blastic osseous lesions. The visualized upper abdominal bowel gas pattern is unremarkable. IMPRESSION: Negative chest x-ray. Electronically Signed   By: Malachy Moan M.D.   On: 01/11/2019 15:54   Ct Head Wo Contrast  Result Date: 01/11/2019 CLINICAL DATA:  Vertigo dizziness EXAM: CT HEAD WITHOUT CONTRAST TECHNIQUE: Contiguous axial images were obtained from the base of the skull through the vertex without intravenous contrast. COMPARISON:  MRI 01/12/2017 FINDINGS: Brain: No acute territorial infarction, hemorrhage or intracranial mass. Chronic lacunar infarct left thalamus. Mild small vessel ischemic changes of the white matter. Normal ventricle size Vascular: No hyperdense vessels.  Carotid vascular calcification Skull: Normal. Negative for fracture or focal lesion. Sinuses/Orbits: No acute finding. Other: None IMPRESSION: 1. No CT evidence  for acute intracranial abnormality. 2. Mild small vessel ischemic changes of the white matter Electronically Signed   By: Jasmine Pang M.D.   On: 01/11/2019 18:33   Ct Angio Chest/abd/pel For Dissection W And/or W/wo  Result Date: 01/11/2019 CLINICAL DATA:  Left-sided chest and shoulder pain radiating into the neck and back EXAM: CT ANGIOGRAPHY CHEST, ABDOMEN AND PELVIS TECHNIQUE: Multidetector CT imaging through the chest, abdomen and pelvis was performed using the standard protocol during bolus administration of intravenous contrast. Multiplanar reconstructed images and MIPs were obtained and reviewed to evaluate the vascular anatomy. CONTRAST:  OMNIPAQUE IOHEXOL 350 MG/ML SOLN COMPARISON:  None. FINDINGS: CTA CHEST FINDINGS Cardiovascular: Thoracic aorta demonstrates a normal branching pattern. No aneurysmal dilatation or focal dissection is noted. Mild atherosclerotic plaque is noted in the descending thoracic aorta. No cardiac enlargement is seen. No significant coronary calcifications are noted. Pulmonary artery demonstrates a normal branching pattern. No filling defect to suggest pulmonary embolism is seen. Mediastinum/Nodes: Thoracic inlet is within normal limits with the exception of hypodense nodule within the left lobe of thyroid. This measures approximately 12 mm in greatest dimension. No hilar or mediastinal adenopathy is noted. The esophagus as visualized is within normal limits. Lungs/Pleura: Lungs are well aerated without focal infiltrate or sizable effusion. No pneumothorax is noted. Musculoskeletal: No acute bony abnormality is seen. Review of the MIP images confirms the above findings. CTA ABDOMEN AND PELVIS FINDINGS VASCULAR Aorta: Abdominal aorta demonstrates atherosclerotic calcification without aneurysmal dilatation or dissection. No focal stenosis is seen. Celiac: Patent without evidence of aneurysm, dissection, vasculitis or significant stenosis. SMA: Patent without evidence of  aneurysm, dissection, vasculitis or significant stenosis. Renals: Single renal artery on the right with dual renal arteries on the left. No obstructive changes are seen. No significant atherosclerotic changes noted. IMA: Patent without evidence of aneurysm, dissection, vasculitis or significant stenosis. Iliacs: Mild atherosclerotic calcifications are seen without focal stenosis or aneurysmal dilatation. Veins: No specific venous abnormality is noted. Review of the MIP images confirms the above findings. NON-VASCULAR Hepatobiliary: Liver is within normal limits. Gallbladder is well distended with multiple gallstones within. Pancreas: Unremarkable. No pancreatic ductal dilatation or surrounding inflammatory changes. Spleen: Normal in size without focal abnormality. Adrenals/Urinary Tract: Adrenal glands are unremarkable. Kidneys are normal, without renal calculi, focal lesion, or hydronephrosis. Bladder is unremarkable. Stomach/Bowel: Scattered diverticular changes noted without evidence of diverticulitis. The appendix is within normal limits. Small bowel and stomach  are unremarkable. Mild hiatal hernia is noted. Lymphatic: No significant lymphadenopathy is noted. Reproductive: Uterus and bilateral adnexa are unremarkable. Other: No abdominal wall hernia or abnormality. No abdominopelvic ascites. Musculoskeletal: No acute or significant osseous findings. Review of the MIP images confirms the above findings. IMPRESSION: 1. CTA OF THE CHEST: No evidence of aortic dissection or aneurysmal dilatation. 2. No evidence of pulmonary emboli. 3. 12 mm left thyroid nodule. Nonemergent ultrasound can be performed as clinically indicated. 4. CT of the ABDOMEN AND PELVIS: No aortic abnormality is noted. 5. Diverticulosis without diverticulitis. 6. Cholelithiasis without complicating factors. Electronically Signed   By: Alcide Clever M.D.   On: 01/11/2019 18:39     ED Course/Procedures     Procedures  MDM  Imaging is  negative for acute pathology.  Incidental finding of thyroid nodule discussed with patient and recommendation for nonemergent outpatient ultrasound was discussed.  Recommend symptomatic management for back pain.  Close PCP follow-up.  Strict return precautions discussed.  Patient is agreeable to plan, well-appearing, safe for discharge.       Robinson, [place] N, PA-C 01/11/19 1901    Robinson, [place] N, PA-C 01/11/19 1901    Tilden Fossa, MD 01/13/19 [phone]

## 2019-01-11 NOTE — ED Provider Notes (Signed)
[facility] Provider Note   CSN: [id number] Arrival date & time: 01/11/19  1348    History   Chief Complaint Chief Complaint  Patient presents with   Shoulder Pain     HPI [facility] is a 78 y.o. female.     The history is provided by the patient. No language interpreter was used.  Shoulder Pain   [facility] is a 78 y.o. female who presents to the Emergency Department complaining of back pain. History is limited as patient is a vague historian. She complains of a week and a half of pain to her left upper thoracic back. Symptoms began four days after a coritisone injection in her right wrist. Pain waxes and wanes and is at times worse when she turns her head to the left, but is not consistently reproducible on range of motion. She also complains of a whooshing feeling in her head on the left side as well as sinus drainage. The pain spreads down to her left lower back. The pain in her back is described as an indigestion and gurgling sensation. She denies any fevers, chest pain, shortness of breath, nausea, vomiting, dysuria, diarrhea, constipation, HA. She attributes everything to beginning four days after the cortisone injection and thinks that her sugar spiked as well as her heart rate and blood pressure. She has a history of hypertension and diabetes and takes Cozaar and metformin. She took her medications today. Past Medical History:  Diagnosis Date   Diabetes mellitus without complication (HCC)    Hypertension    Pinched nerve in neck    Seasonal allergies     There are no active problems to display for this patient.   Past Surgical History:  Procedure Laterality Date   COLONOSCOPY     DILATATION & CURETTAGE/HYSTEROSCOPY WITH MYOSURE N/A 05/20/2017   Procedure: DILATATION & CURETTAGE/HYSTEROSCOPY WITH MYOSURE;  Surgeon: Myna Hidalgo, DO;  Location: [facility];  Service: Gynecology;  Laterality: N/A;  Polypectomy    DILATION AND CURETTAGE OF UTERUS       OB History   No obstetric history on file.      Home Medications    Prior to Admission medications   Medication Sig Start Date End Date Taking? Authorizing Provider  losartan (COZAAR) 25 MG tablet Take 25 mg by mouth daily with lunch.     [provider]  metFORMIN (GLUCOPHAGE) 500 MG tablet Take 250 mg by mouth 2 (two) times daily.     [provider]  Vitamin D, Ergocalciferol, (DRISDOL) 50000 units CAPS capsule Take 50,000 Units by mouth every Thursday.    [provider]    Family History History reviewed. No pertinent family history.  Social History Social History   Tobacco Use   Smoking status: Former Smoker    Last attempt to quit: 08/19/1983    Years since quitting: 35.4   Smokeless tobacco: Never Used  Substance Use Topics   Alcohol use: No   Drug use: No     Allergies   Align [acidophilus]; Clindamycin/lincomycin; Ramipril; Red yeast rice [monascus purpureus went yeast]; and Amoxicillin   Review of Systems Review of Systems  All other systems reviewed and are negative.    Physical Exam Updated Vital Signs BP (!) 170/66    Pulse 62    Temp 97.8 F (36.6 C) (Oral)    Resp 15    SpO2 100%   Physical Exam Vitals signs and nursing note reviewed.  Constitutional:  Appearance: She is well-developed.  HENT:     Head: Normocephalic and atraumatic.  Eyes:     Extraocular Movements: Extraocular movements intact.     Pupils: Pupils are equal, round, and reactive to light.  Cardiovascular:     Rate and Rhythm: Normal rate and regular rhythm.     Heart sounds: No murmur.  Pulmonary:     Effort: Pulmonary effort is normal. No respiratory distress.     Breath sounds: Normal breath sounds.  Abdominal:     Palpations: Abdomen is soft.     Tenderness: There is no abdominal tenderness. There is no guarding or rebound.  Musculoskeletal:        General: No swelling or tenderness.   Skin:    General: Skin is warm and dry.  Neurological:     Mental Status: She is alert and oriented to person, place, and time.     Comments: 5/5 strength in all four extremities with sensation to light touch intact in all four extremities.  No pronator drift.  Visual fields grossly intact.    Psychiatric:        Behavior: Behavior normal.      ED Treatments / Results  Labs (all labs ordered are listed, but only abnormal results are displayed) Labs Reviewed  COMPREHENSIVE METABOLIC PANEL - Abnormal; Notable for the following components:      Result Value   Glucose, Bld 143 (*)    All other components within normal limits  CBC WITH DIFFERENTIAL/PLATELET  TROPONIN I    EKG EKG Interpretation  Date/Time:  Wednesday Jan 11 2019 14:00:53 EDT Ventricular Rate:  87 PR Interval:  148 QRS Duration: 132 QT Interval:  398 QTC Calculation: 478 R Axis:   -65 Text Interpretation:  Normal sinus rhythm Possible Left atrial enlargement Left axis deviation Right bundle branch block Abnormal ECG no significant change since Sept 2018 Confirmed by Pricilla Loveless [phone]) on 01/11/2019 2:04:17 PM   Radiology Dg Chest 2 View  Result Date: 01/11/2019 CLINICAL DATA:  78 year old female with left shoulder pain EXAM: CHEST - 2 VIEW COMPARISON:  Prior chest x-ray 12/25/2014 FINDINGS: The lungs are clear and negative for focal airspace consolidation, pulmonary edema or suspicious pulmonary nodule. No pleural effusion or pneumothorax. Cardiac and mediastinal contours are within normal limits. No acute fracture or lytic or blastic osseous lesions. The visualized upper abdominal bowel gas pattern is unremarkable. IMPRESSION: Negative chest x-ray. Electronically Signed   By: Malachy Moan M.D.   On: 01/11/2019 15:54   Ct Head Wo Contrast  Result Date: 01/11/2019 CLINICAL DATA:  Vertigo dizziness EXAM: CT HEAD WITHOUT CONTRAST TECHNIQUE: Contiguous axial images were obtained from the base of the skull  through the vertex without intravenous contrast. COMPARISON:  MRI 01/12/2017 FINDINGS: Brain: No acute territorial infarction, hemorrhage or intracranial mass. Chronic lacunar infarct left thalamus. Mild small vessel ischemic changes of the white matter. Normal ventricle size Vascular: No hyperdense vessels.  Carotid vascular calcification Skull: Normal. Negative for fracture or focal lesion. Sinuses/Orbits: No acute finding. Other: None IMPRESSION: 1. No CT evidence for acute intracranial abnormality. 2. Mild small vessel ischemic changes of the white matter Electronically Signed   By: Jasmine Pang M.D.   On: 01/11/2019 18:33   Ct Angio Chest/abd/pel For Dissection W And/or W/wo  Result Date: 01/11/2019 CLINICAL DATA:  Left-sided chest and shoulder pain radiating into the neck and back EXAM: CT ANGIOGRAPHY CHEST, ABDOMEN AND PELVIS TECHNIQUE: Multidetector CT imaging through the chest, abdomen and pelvis  was performed using the standard protocol during bolus administration of intravenous contrast. Multiplanar reconstructed images and MIPs were obtained and reviewed to evaluate the vascular anatomy. CONTRAST:  OMNIPAQUE IOHEXOL 350 MG/ML SOLN COMPARISON:  None. FINDINGS: CTA CHEST FINDINGS Cardiovascular: Thoracic aorta demonstrates a normal branching pattern. No aneurysmal dilatation or focal dissection is noted. Mild atherosclerotic plaque is noted in the descending thoracic aorta. No cardiac enlargement is seen. No significant coronary calcifications are noted. Pulmonary artery demonstrates a normal branching pattern. No filling defect to suggest pulmonary embolism is seen. Mediastinum/Nodes: Thoracic inlet is within normal limits with the exception of hypodense nodule within the left lobe of thyroid. This measures approximately 12 mm in greatest dimension. No hilar or mediastinal adenopathy is noted. The esophagus as visualized is within normal limits. Lungs/Pleura: Lungs are well aerated without focal  infiltrate or sizable effusion. No pneumothorax is noted. Musculoskeletal: No acute bony abnormality is seen. Review of the MIP images confirms the above findings. CTA ABDOMEN AND PELVIS FINDINGS VASCULAR Aorta: Abdominal aorta demonstrates atherosclerotic calcification without aneurysmal dilatation or dissection. No focal stenosis is seen. Celiac: Patent without evidence of aneurysm, dissection, vasculitis or significant stenosis. SMA: Patent without evidence of aneurysm, dissection, vasculitis or significant stenosis. Renals: Single renal artery on the right with dual renal arteries on the left. No obstructive changes are seen. No significant atherosclerotic changes noted. IMA: Patent without evidence of aneurysm, dissection, vasculitis or significant stenosis. Iliacs: Mild atherosclerotic calcifications are seen without focal stenosis or aneurysmal dilatation. Veins: No specific venous abnormality is noted. Review of the MIP images confirms the above findings. NON-VASCULAR Hepatobiliary: Liver is within normal limits. Gallbladder is well distended with multiple gallstones within. Pancreas: Unremarkable. No pancreatic ductal dilatation or surrounding inflammatory changes. Spleen: Normal in size without focal abnormality. Adrenals/Urinary Tract: Adrenal glands are unremarkable. Kidneys are normal, without renal calculi, focal lesion, or hydronephrosis. Bladder is unremarkable. Stomach/Bowel: Scattered diverticular changes noted without evidence of diverticulitis. The appendix is within normal limits. Small bowel and stomach are unremarkable. Mild hiatal hernia is noted. Lymphatic: No significant lymphadenopathy is noted. Reproductive: Uterus and bilateral adnexa are unremarkable. Other: No abdominal wall hernia or abnormality. No abdominopelvic ascites. Musculoskeletal: No acute or significant osseous findings. Review of the MIP images confirms the above findings. IMPRESSION: 1. CTA OF THE CHEST: No evidence of  aortic dissection or aneurysmal dilatation. 2. No evidence of pulmonary emboli. 3. 12 mm left thyroid nodule. Nonemergent ultrasound can be performed as clinically indicated. 4. CT of the ABDOMEN AND PELVIS: No aortic abnormality is noted. 5. Diverticulosis without diverticulitis. 6. Cholelithiasis without complicating factors. Electronically Signed   By: Alcide Clever M.D.   On: 01/11/2019 18:39    Procedures Procedures (including critical care time)  Medications Ordered in ED Medications  iohexol (OMNIPAQUE) 350 MG/ML injection 100 mL (100 mLs Intravenous Contrast Given 01/11/19 1809)     Initial Impression / Assessment and Plan / ED Course  I have reviewed the triage vital signs and the nursing notes.  Pertinent labs & imaging results that were available during my care of the patient were reviewed by me and considered in my medical decision making (see chart for details).        Patient here for evaluation of a week and a half of fleeting left-sided upper and lower back pain with associated whooshing feeling in her head. She is well appearing on evaluation with no focal neurologic deficits. She is well perfused on examination. Given reports of migratory symptoms  concern for possible dissection, plan to obtain CTA. Current presentation is not consistent with subarachnoid hemorrhage, CVA. Patient care transferred pending CT scans.  Final Clinical Impressions(s) / ED Diagnoses   Final diagnoses:  Acute left-sided thoracic back pain    ED Discharge Orders    None       Tilden Fossaees, Taylia Berber, MD 01/11/19 2014

## 2019-01-11 NOTE — Discharge Instructions (Addendum)
You can take tylenol as needed for back pain. Your images and lab work today are reassuring. Discuss your incidental finding of a thyroid nodule with your primary care provider.  You may need an outpatient ultrasound.

## 2019-01-11 NOTE — ED Notes (Signed)
Patient verbalizes understanding of discharge instructions. Opportunity for questioning and answers were provided. Armband removed by staff, pt discharged from ED. Pt ambulatory at discharge, wheeled to lobby for comfort.  

## 2019-01-11 NOTE — ED Notes (Signed)
EDP at bedside  

## 2019-01-11 NOTE — ED Triage Notes (Signed)
Pt in c/o left shoulder pain that started last night, pain starts in the back of her neck and radiates down her arm and into her back, not reproducable with movement, denies chest pain or shortness of breath, no distress noted

## 2019-02-09 ENCOUNTER — Other Ambulatory Visit: Payer: Self-pay | Admitting: Family Medicine

## 2019-02-09 DIAGNOSIS — E041 Nontoxic single thyroid nodule: Secondary | ICD-10-CM

## 2019-03-15 ENCOUNTER — Ambulatory Visit
Admission: RE | Admit: 2019-03-15 | Discharge: 2019-03-15 | Disposition: A | Discharge status: Home / Self Care | Payer: Medicare Other | Source: Ambulatory Visit | Attending: Family Medicine | Admitting: Family Medicine

## 2019-03-15 DIAGNOSIS — E041 Nontoxic single thyroid nodule: Secondary | ICD-10-CM

## 2020-09-26 DIAGNOSIS — I10 Essential (primary) hypertension: Secondary | ICD-10-CM | POA: Diagnosis not present

## 2020-09-26 DIAGNOSIS — J019 Acute sinusitis, unspecified: Secondary | ICD-10-CM | POA: Diagnosis not present

## 2020-10-21 DIAGNOSIS — H40053 Ocular hypertension, bilateral: Secondary | ICD-10-CM | POA: Diagnosis not present

## 2020-12-13 DIAGNOSIS — G5601 Carpal tunnel syndrome, right upper limb: Secondary | ICD-10-CM | POA: Diagnosis not present

## 2020-12-13 DIAGNOSIS — M1811 Unilateral primary osteoarthritis of first carpometacarpal joint, right hand: Secondary | ICD-10-CM | POA: Diagnosis not present

## 2021-01-02 DIAGNOSIS — I10 Essential (primary) hypertension: Secondary | ICD-10-CM | POA: Diagnosis not present

## 2021-01-02 DIAGNOSIS — E7800 Pure hypercholesterolemia, unspecified: Secondary | ICD-10-CM | POA: Diagnosis not present

## 2021-01-02 DIAGNOSIS — H9319 Tinnitus, unspecified ear: Secondary | ICD-10-CM | POA: Diagnosis not present

## 2021-01-02 DIAGNOSIS — E1169 Type 2 diabetes mellitus with other specified complication: Secondary | ICD-10-CM | POA: Diagnosis not present

## 2021-01-02 DIAGNOSIS — J309 Allergic rhinitis, unspecified: Secondary | ICD-10-CM | POA: Diagnosis not present

## 2021-01-13 DIAGNOSIS — H04123 Dry eye syndrome of bilateral lacrimal glands: Secondary | ICD-10-CM | POA: Diagnosis not present

## 2021-01-13 DIAGNOSIS — H5310 Unspecified subjective visual disturbances: Secondary | ICD-10-CM | POA: Diagnosis not present

## 2021-01-24 DIAGNOSIS — H04123 Dry eye syndrome of bilateral lacrimal glands: Secondary | ICD-10-CM | POA: Diagnosis not present

## 2021-01-24 DIAGNOSIS — G5601 Carpal tunnel syndrome, right upper limb: Secondary | ICD-10-CM | POA: Diagnosis not present

## 2021-01-24 DIAGNOSIS — M1811 Unilateral primary osteoarthritis of first carpometacarpal joint, right hand: Secondary | ICD-10-CM | POA: Diagnosis not present

## 2021-01-28 DIAGNOSIS — E1169 Type 2 diabetes mellitus with other specified complication: Secondary | ICD-10-CM | POA: Diagnosis not present

## 2021-01-28 DIAGNOSIS — H04123 Dry eye syndrome of bilateral lacrimal glands: Secondary | ICD-10-CM | POA: Diagnosis not present

## 2021-01-28 DIAGNOSIS — Z7984 Long term (current) use of oral hypoglycemic drugs: Secondary | ICD-10-CM | POA: Diagnosis not present

## 2021-02-11 DIAGNOSIS — H401131 Primary open-angle glaucoma, bilateral, mild stage: Secondary | ICD-10-CM | POA: Diagnosis not present

## 2021-02-11 DIAGNOSIS — H04123 Dry eye syndrome of bilateral lacrimal glands: Secondary | ICD-10-CM | POA: Diagnosis not present

## 2021-02-15 DIAGNOSIS — H109 Unspecified conjunctivitis: Secondary | ICD-10-CM | POA: Diagnosis not present

## 2021-02-20 DIAGNOSIS — F324 Major depressive disorder, single episode, in partial remission: Secondary | ICD-10-CM | POA: Diagnosis not present

## 2021-03-24 DIAGNOSIS — H40053 Ocular hypertension, bilateral: Secondary | ICD-10-CM | POA: Diagnosis not present

## 2021-03-24 DIAGNOSIS — H401131 Primary open-angle glaucoma, bilateral, mild stage: Secondary | ICD-10-CM | POA: Diagnosis not present

## 2021-04-28 DIAGNOSIS — H0100A Unspecified blepharitis right eye, upper and lower eyelids: Secondary | ICD-10-CM | POA: Diagnosis not present

## 2021-04-28 DIAGNOSIS — H1789 Other corneal scars and opacities: Secondary | ICD-10-CM | POA: Diagnosis not present

## 2021-04-28 DIAGNOSIS — H0100B Unspecified blepharitis left eye, upper and lower eyelids: Secondary | ICD-10-CM | POA: Diagnosis not present

## 2021-04-28 DIAGNOSIS — H04123 Dry eye syndrome of bilateral lacrimal glands: Secondary | ICD-10-CM | POA: Diagnosis not present

## 2021-05-16 DIAGNOSIS — M542 Cervicalgia: Secondary | ICD-10-CM | POA: Diagnosis not present

## 2021-05-16 DIAGNOSIS — F419 Anxiety disorder, unspecified: Secondary | ICD-10-CM | POA: Diagnosis not present

## 2021-06-27 DIAGNOSIS — H401131 Primary open-angle glaucoma, bilateral, mild stage: Secondary | ICD-10-CM | POA: Diagnosis not present

## 2021-07-08 DIAGNOSIS — H35342 Macular cyst, hole, or pseudohole, left eye: Secondary | ICD-10-CM | POA: Diagnosis not present

## 2021-07-08 DIAGNOSIS — H2513 Age-related nuclear cataract, bilateral: Secondary | ICD-10-CM | POA: Diagnosis not present

## 2021-07-15 DIAGNOSIS — H401131 Primary open-angle glaucoma, bilateral, mild stage: Secondary | ICD-10-CM | POA: Diagnosis not present

## 2021-07-15 DIAGNOSIS — H2513 Age-related nuclear cataract, bilateral: Secondary | ICD-10-CM | POA: Diagnosis not present

## 2021-07-18 DIAGNOSIS — Z0000 Encounter for general adult medical examination without abnormal findings: Secondary | ICD-10-CM | POA: Diagnosis not present

## 2021-07-18 DIAGNOSIS — E1169 Type 2 diabetes mellitus with other specified complication: Secondary | ICD-10-CM | POA: Diagnosis not present

## 2021-07-18 DIAGNOSIS — H409 Unspecified glaucoma: Secondary | ICD-10-CM | POA: Diagnosis not present

## 2021-07-18 DIAGNOSIS — Z23 Encounter for immunization: Secondary | ICD-10-CM | POA: Diagnosis not present

## 2021-07-18 DIAGNOSIS — E7800 Pure hypercholesterolemia, unspecified: Secondary | ICD-10-CM | POA: Diagnosis not present

## 2021-07-18 DIAGNOSIS — Z7984 Long term (current) use of oral hypoglycemic drugs: Secondary | ICD-10-CM | POA: Diagnosis not present

## 2021-07-18 DIAGNOSIS — E559 Vitamin D deficiency, unspecified: Secondary | ICD-10-CM | POA: Diagnosis not present

## 2021-07-18 DIAGNOSIS — I10 Essential (primary) hypertension: Secondary | ICD-10-CM | POA: Diagnosis not present

## 2021-07-18 DIAGNOSIS — F324 Major depressive disorder, single episode, in partial remission: Secondary | ICD-10-CM | POA: Diagnosis not present

## 2021-07-18 DIAGNOSIS — Z79899 Other long term (current) drug therapy: Secondary | ICD-10-CM | POA: Diagnosis not present

## 2021-07-19 DIAGNOSIS — K30 Functional dyspepsia: Secondary | ICD-10-CM | POA: Diagnosis not present

## 2021-07-19 DIAGNOSIS — F418 Other specified anxiety disorders: Secondary | ICD-10-CM | POA: Diagnosis not present

## 2021-07-28 DIAGNOSIS — H02055 Trichiasis without entropian left lower eyelid: Secondary | ICD-10-CM | POA: Diagnosis not present

## 2021-08-04 DIAGNOSIS — H2512 Age-related nuclear cataract, left eye: Secondary | ICD-10-CM | POA: Diagnosis not present

## 2021-08-04 DIAGNOSIS — H35342 Macular cyst, hole, or pseudohole, left eye: Secondary | ICD-10-CM | POA: Diagnosis not present

## 2021-08-05 DIAGNOSIS — H35342 Macular cyst, hole, or pseudohole, left eye: Secondary | ICD-10-CM | POA: Diagnosis not present

## 2021-08-12 DIAGNOSIS — H35342 Macular cyst, hole, or pseudohole, left eye: Secondary | ICD-10-CM | POA: Diagnosis not present

## 2021-08-25 DIAGNOSIS — H02055 Trichiasis without entropian left lower eyelid: Secondary | ICD-10-CM | POA: Diagnosis not present

## 2021-09-03 DIAGNOSIS — H35342 Macular cyst, hole, or pseudohole, left eye: Secondary | ICD-10-CM | POA: Diagnosis not present

## 2021-11-14 DIAGNOSIS — H43811 Vitreous degeneration, right eye: Secondary | ICD-10-CM | POA: Diagnosis not present

## 2021-11-14 DIAGNOSIS — H31012 Macula scars of posterior pole (postinflammatory) (post-traumatic), left eye: Secondary | ICD-10-CM | POA: Diagnosis not present

## 2021-11-14 DIAGNOSIS — H2511 Age-related nuclear cataract, right eye: Secondary | ICD-10-CM | POA: Diagnosis not present

## 2021-11-28 DIAGNOSIS — N182 Chronic kidney disease, stage 2 (mild): Secondary | ICD-10-CM | POA: Diagnosis not present

## 2021-11-28 DIAGNOSIS — F330 Major depressive disorder, recurrent, mild: Secondary | ICD-10-CM | POA: Diagnosis not present

## 2021-11-28 DIAGNOSIS — E1121 Type 2 diabetes mellitus with diabetic nephropathy: Secondary | ICD-10-CM | POA: Diagnosis not present

## 2021-11-28 DIAGNOSIS — I10 Essential (primary) hypertension: Secondary | ICD-10-CM | POA: Diagnosis not present

## 2021-11-29 DIAGNOSIS — J0101 Acute recurrent maxillary sinusitis: Secondary | ICD-10-CM | POA: Diagnosis not present

## 2021-12-01 ENCOUNTER — Other Ambulatory Visit: Payer: Self-pay | Admitting: Family Medicine

## 2021-12-01 DIAGNOSIS — E2839 Other primary ovarian failure: Secondary | ICD-10-CM

## 2021-12-25 DIAGNOSIS — H401131 Primary open-angle glaucoma, bilateral, mild stage: Secondary | ICD-10-CM | POA: Diagnosis not present

## 2021-12-25 DIAGNOSIS — Z961 Presence of intraocular lens: Secondary | ICD-10-CM | POA: Diagnosis not present

## 2022-01-16 DIAGNOSIS — M5412 Radiculopathy, cervical region: Secondary | ICD-10-CM | POA: Diagnosis not present

## 2022-01-16 DIAGNOSIS — E1169 Type 2 diabetes mellitus with other specified complication: Secondary | ICD-10-CM | POA: Diagnosis not present

## 2022-01-16 DIAGNOSIS — E7800 Pure hypercholesterolemia, unspecified: Secondary | ICD-10-CM | POA: Diagnosis not present

## 2022-01-16 DIAGNOSIS — I10 Essential (primary) hypertension: Secondary | ICD-10-CM | POA: Diagnosis not present

## 2022-01-16 DIAGNOSIS — M5030 Other cervical disc degeneration, unspecified cervical region: Secondary | ICD-10-CM | POA: Diagnosis not present

## 2022-01-27 DIAGNOSIS — H2511 Age-related nuclear cataract, right eye: Secondary | ICD-10-CM | POA: Diagnosis not present

## 2022-01-27 DIAGNOSIS — H31012 Macula scars of posterior pole (postinflammatory) (post-traumatic), left eye: Secondary | ICD-10-CM | POA: Diagnosis not present

## 2022-03-19 DIAGNOSIS — G5603 Carpal tunnel syndrome, bilateral upper limbs: Secondary | ICD-10-CM | POA: Diagnosis not present

## 2022-03-19 DIAGNOSIS — G5601 Carpal tunnel syndrome, right upper limb: Secondary | ICD-10-CM | POA: Diagnosis not present

## 2022-03-19 DIAGNOSIS — G5602 Carpal tunnel syndrome, left upper limb: Secondary | ICD-10-CM | POA: Diagnosis not present

## 2022-03-26 DIAGNOSIS — E1165 Type 2 diabetes mellitus with hyperglycemia: Secondary | ICD-10-CM | POA: Diagnosis not present

## 2022-03-26 DIAGNOSIS — M545 Low back pain, unspecified: Secondary | ICD-10-CM | POA: Diagnosis not present

## 2022-03-26 DIAGNOSIS — R81 Glycosuria: Secondary | ICD-10-CM | POA: Diagnosis not present

## 2022-03-26 DIAGNOSIS — R109 Unspecified abdominal pain: Secondary | ICD-10-CM | POA: Diagnosis not present

## 2022-04-20 DIAGNOSIS — H401131 Primary open-angle glaucoma, bilateral, mild stage: Secondary | ICD-10-CM | POA: Diagnosis not present

## 2022-04-29 DIAGNOSIS — I10 Essential (primary) hypertension: Secondary | ICD-10-CM | POA: Diagnosis not present

## 2022-04-29 DIAGNOSIS — M25562 Pain in left knee: Secondary | ICD-10-CM | POA: Diagnosis not present

## 2022-05-01 DIAGNOSIS — M25562 Pain in left knee: Secondary | ICD-10-CM | POA: Diagnosis not present

## 2022-05-05 DIAGNOSIS — J069 Acute upper respiratory infection, unspecified: Secondary | ICD-10-CM | POA: Diagnosis not present

## 2022-05-05 DIAGNOSIS — I10 Essential (primary) hypertension: Secondary | ICD-10-CM | POA: Diagnosis not present

## 2022-05-05 DIAGNOSIS — Z03818 Encounter for observation for suspected exposure to other biological agents ruled out: Secondary | ICD-10-CM | POA: Diagnosis not present

## 2022-05-15 DIAGNOSIS — M25562 Pain in left knee: Secondary | ICD-10-CM | POA: Diagnosis not present

## 2022-05-20 DIAGNOSIS — M25562 Pain in left knee: Secondary | ICD-10-CM | POA: Diagnosis not present

## 2022-06-01 DIAGNOSIS — M25562 Pain in left knee: Secondary | ICD-10-CM | POA: Diagnosis not present

## 2022-06-04 DIAGNOSIS — M25562 Pain in left knee: Secondary | ICD-10-CM | POA: Diagnosis not present

## 2022-06-08 DIAGNOSIS — M25562 Pain in left knee: Secondary | ICD-10-CM | POA: Diagnosis not present

## 2022-06-18 ENCOUNTER — Encounter (HOSPITAL_BASED_OUTPATIENT_CLINIC_OR_DEPARTMENT_OTHER): Payer: Self-pay | Admitting: Family

## 2022-06-18 ENCOUNTER — Telehealth (HOSPITAL_BASED_OUTPATIENT_CLINIC_OR_DEPARTMENT_OTHER): Payer: Self-pay | Admitting: Family

## 2022-06-18 ENCOUNTER — Ambulatory Visit (HOSPITAL_BASED_OUTPATIENT_CLINIC_OR_DEPARTMENT_OTHER): Payer: Medicare PPO | Admitting: Family

## 2022-06-18 VITALS — BP 172/62 | HR 64 | Ht 61.0 in | Wt 129.0 lb

## 2022-06-18 DIAGNOSIS — E1165 Type 2 diabetes mellitus with hyperglycemia: Secondary | ICD-10-CM | POA: Diagnosis not present

## 2022-06-18 DIAGNOSIS — I10 Essential (primary) hypertension: Secondary | ICD-10-CM

## 2022-06-18 DIAGNOSIS — I4510 Unspecified right bundle-branch block: Secondary | ICD-10-CM

## 2022-06-18 MED ORDER — LOSARTAN POTASSIUM 25 MG PO TABS: 25.0000 mg | ORAL_TABLET | Freq: Two times a day (BID) | ORAL | 2 refills | Status: DC

## 2022-06-18 NOTE — Patient Instructions (Addendum)
Medication Instructions:  Your physician has recommended you make the following change in your medication:   Start: Losartan 25mg  twice daily    Labwork: Please return for Lab work in one week for Fasting Bmp, Catecholamines, Metanephrines, and Cortisol. You may come to the...   [facility] (3rd floor) [address], [place], [place]  Open: 8am-Noon and 1pm-4:30pm  Please ring the doorbell on the small table when you exit the elevator and the Lab Tech will come get you  [facility] at [facility] [address], [place], [place] Open: 8am-1pm, then 2pm-4:30pm   [facility]- Please see attached locations sheet stapled to your lab work with address and hours.     Testing/Procedures: Your provider has recommended a 24 hour blood pressure cuff. The scheduling team will reach out to get you scheduled.   Your physician has requested that you have a renal artery duplex. During this test, an ultrasound is used to evaluate blood flow to the kidneys. Allow one hour for this exam. Do not eat after midnight the day before and avoid carbonated beverages. Take your medications as you usually do.    Follow-Up: Please follow up in one month in HTN clinic with Dr. [id number] or Duke Salvia, NP     Referrals:  We have referred you to prep today. They will reach out to you to get you scheduled.    Special Instructionsg :  DASH EatinPlan DASH stands for Dietary Approaches to Stop Hypertension. The DASH eating plan is a healthy eating plan that has been shown to: Reduce high blood pressure (hypertension). Reduce your risk for type 2 diabetes, heart disease, and stroke. Help with weight loss. What are tips for following this plan? Reading food labels Check food labels for the amount of salt (sodium) per serving. Choose foods with less than 5 percent of the Daily Value of sodium. Generally, foods with less than 300 milligrams (mg) of  sodium per serving fit into this eating plan. To find whole grains, look for the word "whole" as the first word in the ingredient list. Shopping Buy products labeled as "low-sodium" or "no salt added." Buy fresh foods. Avoid canned foods and pre-made or frozen meals. Cooking Avoid adding salt when cooking. Use salt-free seasonings or herbs instead of table salt or sea salt. Check with your health care provider or pharmacist before using salt substitutes. Do not fry foods. Cook foods using healthy methods such as baking, boiling, grilling, roasting, and broiling instead. Cook with heart-healthy oils, such as olive, canola, avocado, soybean, or sunflower oil. Meal planning  Eat a balanced diet that includes: 4 or more servings of fruits and 4 or more servings of vegetables each day. Try to fill one-half of your plate with fruits and vegetables. 6-8 servings of whole grains each day. Less than 6 oz (170 g) of lean meat, poultry, or fish each day. A 3-oz (85-g) serving of meat is about the same size as a deck of cards. One egg equals 1 oz (28 g). 2-3 servings of low-fat dairy each day. One serving is 1 cup (237 mL). 1 serving of nuts, seeds, or beans 5 times each week. 2-3 servings of heart-healthy fats. Healthy fats called omega-3 fatty acids are found in foods such as walnuts, flaxseeds, fortified milks, and eggs. These fats are also found in cold-water fish, such as sardines, salmon, and mackerel. Limit how much you eat of: Canned or prepackaged foods. Food that is  high in trans fat, such as some fried foods. Food that is high in saturated fat, such as fatty meat. Desserts and other sweets, sugary drinks, and other foods with added sugar. Full-fat dairy products. Do not salt foods before eating. Do not eat more than 4 egg yolks a week. Try to eat at least 2 vegetarian meals a week. Eat more home-cooked food and less restaurant, buffet, and fast food. Lifestyle When eating at a restaurant,  ask that your food be prepared with less salt or no salt, if possible. If you drink alcohol: Limit how much you use to: 0-1 drink a day for women who are not pregnant. 0-2 drinks a day for men. Be aware of how much alcohol is in your drink. In the [place] one drink equals one 12 oz bottle of beer (355 mL), one 5 oz glass of wine (148 mL), or one 1 oz glass of hard liquor (44 mL). General information Avoid eating more than 2,300 mg of salt a day. If you have hypertension, you may need to reduce your sodium intake to 1,500 mg a day. Work with your health care provider to maintain a healthy body weight or to lose weight. Ask what an ideal weight is for you. Get at least 30 minutes of exercise that causes your heart to beat faster (aerobic exercise) most days of the week. Activities may include walking, swimming, or biking. Work with your health care provider or dietitian to adjust your eating plan to your individual calorie needs. What foods should I eat? Fruits All fresh, dried, or frozen fruit. Canned fruit in natural juice (without added sugar). Vegetables Fresh or frozen vegetables (raw, steamed, roasted, or grilled). Low-sodium or reduced-sodium tomato and vegetable juice. Low-sodium or reduced-sodium tomato sauce and tomato paste. Low-sodium or reduced-sodium canned vegetables. Grains Whole-grain or whole-wheat bread. Whole-grain or whole-wheat pasta. Brown rice. [place]. Bulgur. Whole-grain and low-sodium cereals. Pita bread. Low-fat, low-sodium crackers. Whole-wheat flour tortillas. Meats and other proteins Skinless chicken or [place]. Ground chicken or [place]. Pork with fat trimmed off. Fish and seafood. Egg whites. Dried beans, peas, or lentils. Unsalted nuts, nut butters, and seeds. Unsalted canned beans. Lean cuts of beef with fat trimmed off. Low-sodium, lean precooked or cured meat, such as sausages or meat loaves. Dairy Low-fat (1%) or fat-free (skim) milk. Reduced-fat,  low-fat, or fat-free cheeses. Nonfat, low-sodium ricotta or cottage cheese. Low-fat or nonfat yogurt. Low-fat, low-sodium cheese. Fats and oils Soft margarine without trans fats. Vegetable oil. Reduced-fat, low-fat, or light mayonnaise and salad dressings (reduced-sodium). Canola, safflower, olive, avocado, soybean, and sunflower oils. Avocado. Seasonings and condiments Herbs. Spices. Seasoning mixes without salt. Other foods Unsalted popcorn and pretzels. Fat-free sweets. The items listed above may not be a complete list of foods and beverages you can eat. Contact a dietitian for more information. What foods should I avoid? Fruits Canned fruit in a light or heavy syrup. Fried fruit. Fruit in cream or butter sauce. Vegetables Creamed or fried vegetables. Vegetables in a cheese sauce. Regular canned vegetables (not low-sodium or reduced-sodium). Regular canned tomato sauce and paste (not low-sodium or reduced-sodium). Regular tomato and vegetable juice (not low-sodium or reduced-sodium). Angie Fava. Olives. Grains Baked goods made with fat, such as croissants, muffins, or some breads. Dry pasta or rice meal packs. Meats and other proteins Fatty cuts of meat. Ribs. Fried meat. Berniece Salines. Bologna, salami, and other precooked or cured meats, such as sausages or meat loaves. Fat from the back of a pig (fatback). Bratwurst.  Salted nuts and seeds. Canned beans with added salt. Canned or smoked fish. Whole eggs or egg yolks. Chicken or [place] with skin. Dairy Whole or 2% milk, cream, and half-and-half. Whole or full-fat cream cheese. Whole-fat or sweetened yogurt. Full-fat cheese. Nondairy creamers. Whipped toppings. Processed cheese and cheese spreads. Fats and oils Butter. Stick margarine. Lard. Shortening. Ghee. Bacon fat. Tropical oils, such as coconut, palm kernel, or palm oil. Seasonings and condiments Onion salt, garlic salt, seasoned salt, table salt, and sea salt. Worcestershire sauce. Tartar sauce.  Barbecue sauce. Teriyaki sauce. Soy sauce, including reduced-sodium. Steak sauce. Canned and packaged gravies. Fish sauce. Oyster sauce. Cocktail sauce. Store-bought horseradish. Ketchup. Mustard. Meat flavorings and tenderizers. Bouillon cubes. Hot sauces. Pre-made or packaged marinades. Pre-made or packaged taco seasonings. Relishes. Regular salad dressings. Other foods Salted popcorn and pretzels. The items listed above may not be a complete list of foods and beverages you should avoid. Contact a dietitian for more information. Where to find more information National Heart, Lung, and Blood Institute: https://Hazaiah [place]-eaton.com/ American Heart Association: www.heart.org Academy of Nutrition and Dietetics: www.eatright.[place]: www.kidney.org Summary The DASH eating plan is a healthy eating plan that has been shown to reduce high blood pressure (hypertension). It may also reduce your risk for type 2 diabetes, heart disease, and stroke. When on the DASH eating plan, aim to eat more fresh fruits and vegetables, whole grains, lean proteins, low-fat dairy, and heart-healthy fats. With the DASH eating plan, you should limit salt (sodium) intake to 2,300 mg a day. If you have hypertension, you may need to reduce your sodium intake to 1,500 mg a day. Work with your health care provider or dietitian to adjust your eating plan to your individual calorie needs. This information is not intended to replace advice given to you by your health care provider. Make sure you discuss any questions you have with your health care provider. Document Revised: 07/28/2019 Document Reviewed: 07/28/2019 Elsevier Patient Education  [place].

## 2022-06-18 NOTE — Telephone Encounter (Signed)
Left message to call back  

## 2022-06-18 NOTE — Telephone Encounter (Signed)
Spoke with patient and she was concerned she would not be able to check her blood pressure at home especially given her difficulty seeing with things going on with her eyes She also has multiple follow up visits scheduled  Reviewed her upcoming visits and need for them, verbalized understanding.  She has reached out to her PCP regarding upcoming appointments with GI/PCP to discuss No further questions at this time.  Advised if she had any further questions to call back

## 2022-06-18 NOTE — Telephone Encounter (Signed)
New Message:      Patient would like for Kaila to please give her a call when she have time today. She needs to talk to her about some of the appointments Urban Gibson wants her to have.
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
## 2022-06-21 ENCOUNTER — Encounter (HOSPITAL_BASED_OUTPATIENT_CLINIC_OR_DEPARTMENT_OTHER): Payer: Self-pay | Admitting: Family

## 2022-06-26 ENCOUNTER — Telehealth: Payer: Self-pay

## 2022-06-26 NOTE — Telephone Encounter (Signed)
Call from pt wants to wait to after speaking with MD about starting PREP. Will call me when she is ready

## 2022-06-26 NOTE — Telephone Encounter (Signed)
VMT pt requesting call back to discuss referral to PREP 

## 2022-06-30 ENCOUNTER — Telehealth (HOSPITAL_BASED_OUTPATIENT_CLINIC_OR_DEPARTMENT_OTHER): Payer: Self-pay | Admitting: Family

## 2022-06-30 NOTE — Telephone Encounter (Signed)
Left message for patient to call and discuss rescheduling the renal artery duplex ordered by Caitlin Walker, NP 

## 2022-07-08 ENCOUNTER — Encounter (HOSPITAL_BASED_OUTPATIENT_CLINIC_OR_DEPARTMENT_OTHER): Payer: Medicare PPO

## 2022-07-13 NOTE — Telephone Encounter (Signed)
Left message for patient to call and discuss rescheduling the renal artery duplex ordered by Laurann Montana, NP

## 2022-07-16 DIAGNOSIS — M2550 Pain in unspecified joint: Secondary | ICD-10-CM | POA: Diagnosis not present

## 2022-07-16 DIAGNOSIS — E1169 Type 2 diabetes mellitus with other specified complication: Secondary | ICD-10-CM | POA: Diagnosis not present

## 2022-07-16 DIAGNOSIS — I10 Essential (primary) hypertension: Secondary | ICD-10-CM | POA: Diagnosis not present
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
## 2022-07-22 ENCOUNTER — Telehealth: Payer: Self-pay | Admitting: *Deleted

## 2022-07-22 ENCOUNTER — Encounter (HOSPITAL_BASED_OUTPATIENT_CLINIC_OR_DEPARTMENT_OTHER): Payer: Self-pay | Admitting: Family

## 2022-07-22 DIAGNOSIS — E7800 Pure hypercholesterolemia, unspecified: Secondary | ICD-10-CM | POA: Diagnosis not present

## 2022-07-22 DIAGNOSIS — D692 Other nonthrombocytopenic purpura: Secondary | ICD-10-CM | POA: Diagnosis not present

## 2022-07-22 DIAGNOSIS — Z79899 Other long term (current) drug therapy: Secondary | ICD-10-CM | POA: Diagnosis not present

## 2022-07-22 DIAGNOSIS — E1169 Type 2 diabetes mellitus with other specified complication: Secondary | ICD-10-CM | POA: Diagnosis not present

## 2022-07-22 DIAGNOSIS — I10 Essential (primary) hypertension: Secondary | ICD-10-CM | POA: Diagnosis not present

## 2022-07-22 DIAGNOSIS — E559 Vitamin D deficiency, unspecified: Secondary | ICD-10-CM | POA: Diagnosis not present

## 2022-07-22 DIAGNOSIS — Z0000 Encounter for general adult medical examination without abnormal findings: Secondary | ICD-10-CM | POA: Diagnosis not present

## 2022-07-22 DIAGNOSIS — E042 Nontoxic multinodular goiter: Secondary | ICD-10-CM | POA: Diagnosis not present

## 2022-07-22 DIAGNOSIS — J309 Allergic rhinitis, unspecified: Secondary | ICD-10-CM | POA: Diagnosis not present

## 2022-07-22 NOTE — Telephone Encounter (Signed)
Left message for patient to call and discuss rescheduling the renal artery duplex ordered by Gillian Shields, NP--will mail letter requesting patient call

## 2022-07-22 NOTE — Telephone Encounter (Signed)
LMVM- call Yeng Perz in monitors to schedule test at [phone].

## 2022-07-23 ENCOUNTER — Ambulatory Visit (HOSPITAL_BASED_OUTPATIENT_CLINIC_OR_DEPARTMENT_OTHER): Payer: Medicare PPO | Admitting: Family

## 2022-07-23 DIAGNOSIS — I10 Essential (primary) hypertension: Secondary | ICD-10-CM | POA: Diagnosis not present

## 2022-10-02 DIAGNOSIS — E119 Type 2 diabetes mellitus without complications: Secondary | ICD-10-CM | POA: Diagnosis not present

## 2022-10-02 DIAGNOSIS — H401131 Primary open-angle glaucoma, bilateral, mild stage: Secondary | ICD-10-CM | POA: Diagnosis not present

## 2022-10-02 DIAGNOSIS — H524 Presbyopia: Secondary | ICD-10-CM | POA: Diagnosis not present

## 2022-10-09 DIAGNOSIS — H2511 Age-related nuclear cataract, right eye: Secondary | ICD-10-CM | POA: Diagnosis not present

## 2022-10-09 DIAGNOSIS — H35342 Macular cyst, hole, or pseudohole, left eye: Secondary | ICD-10-CM | POA: Diagnosis not present

## 2022-10-30 DIAGNOSIS — H10413 Chronic giant papillary conjunctivitis, bilateral: Secondary | ICD-10-CM | POA: Diagnosis not present

## 2022-11-09 DIAGNOSIS — F330 Major depressive disorder, recurrent, mild: Secondary | ICD-10-CM | POA: Diagnosis not present

## 2022-11-09 DIAGNOSIS — E1165 Type 2 diabetes mellitus with hyperglycemia: Secondary | ICD-10-CM | POA: Diagnosis not present

## 2022-11-09 DIAGNOSIS — I700 Atherosclerosis of aorta: Secondary | ICD-10-CM | POA: Diagnosis not present

## 2022-11-09 DIAGNOSIS — I10 Essential (primary) hypertension: Secondary | ICD-10-CM | POA: Diagnosis not present

## 2022-11-16 DIAGNOSIS — M62838 Other muscle spasm: Secondary | ICD-10-CM | POA: Diagnosis not present

## 2022-11-17 DIAGNOSIS — H401132 Primary open-angle glaucoma, bilateral, moderate stage: Secondary | ICD-10-CM | POA: Diagnosis not present

## 2022-12-11 DIAGNOSIS — H2511 Age-related nuclear cataract, right eye: Secondary | ICD-10-CM | POA: Diagnosis not present

## 2022-12-11 DIAGNOSIS — H401132 Primary open-angle glaucoma, bilateral, moderate stage: Secondary | ICD-10-CM | POA: Diagnosis not present

## 2022-12-13 DIAGNOSIS — H66001 Acute suppurative otitis media without spontaneous rupture of ear drum, right ear: Secondary | ICD-10-CM | POA: Diagnosis not present

## 2022-12-13 DIAGNOSIS — J329 Chronic sinusitis, unspecified: Secondary | ICD-10-CM | POA: Diagnosis not present

## 2022-12-13 DIAGNOSIS — J302 Other seasonal allergic rhinitis: Secondary | ICD-10-CM | POA: Diagnosis not present

## 2022-12-13 DIAGNOSIS — H6993 Unspecified Eustachian tube disorder, bilateral: Secondary | ICD-10-CM | POA: Diagnosis not present

## 2022-12-14 DIAGNOSIS — T7840XA Allergy, unspecified, initial encounter: Secondary | ICD-10-CM | POA: Diagnosis not present

## 2022-12-14 DIAGNOSIS — F324 Major depressive disorder, single episode, in partial remission: Secondary | ICD-10-CM | POA: Diagnosis not present

## 2023-01-06 DIAGNOSIS — F0390 Unspecified dementia without behavioral disturbance: Secondary | ICD-10-CM | POA: Diagnosis not present

## 2023-01-06 DIAGNOSIS — I10 Essential (primary) hypertension: Secondary | ICD-10-CM | POA: Diagnosis not present

## 2023-01-06 DIAGNOSIS — E119 Type 2 diabetes mellitus without complications: Secondary | ICD-10-CM | POA: Diagnosis not present

## 2023-01-15 DIAGNOSIS — H2511 Age-related nuclear cataract, right eye: Secondary | ICD-10-CM | POA: Diagnosis not present

## 2023-01-15 DIAGNOSIS — H35342 Macular cyst, hole, or pseudohole, left eye: Secondary | ICD-10-CM | POA: Diagnosis not present

## 2023-01-22 DIAGNOSIS — E1165 Type 2 diabetes mellitus with hyperglycemia: Secondary | ICD-10-CM | POA: Diagnosis not present

## 2023-01-22 DIAGNOSIS — I10 Essential (primary) hypertension: Secondary | ICD-10-CM | POA: Diagnosis not present

## 2023-02-03 DIAGNOSIS — R109 Unspecified abdominal pain: Secondary | ICD-10-CM | POA: Diagnosis not present

## 2023-02-03 DIAGNOSIS — E1165 Type 2 diabetes mellitus with hyperglycemia: Secondary | ICD-10-CM | POA: Diagnosis not present

## 2023-02-19 DIAGNOSIS — H401131 Primary open-angle glaucoma, bilateral, mild stage: Secondary | ICD-10-CM | POA: Diagnosis not present

## 2023-02-22 DIAGNOSIS — H401131 Primary open-angle glaucoma, bilateral, mild stage: Secondary | ICD-10-CM | POA: Diagnosis not present

## 2023-02-22 DIAGNOSIS — H04123 Dry eye syndrome of bilateral lacrimal glands: Secondary | ICD-10-CM | POA: Diagnosis not present

## 2023-03-16 DIAGNOSIS — H401131 Primary open-angle glaucoma, bilateral, mild stage: Secondary | ICD-10-CM | POA: Diagnosis not present

## 2023-03-19 DIAGNOSIS — H938X2 Other specified disorders of left ear: Secondary | ICD-10-CM | POA: Diagnosis not present

## 2023-03-22 DIAGNOSIS — H401131 Primary open-angle glaucoma, bilateral, mild stage: Secondary | ICD-10-CM | POA: Diagnosis not present

## 2023-03-23 DIAGNOSIS — M5030 Other cervical disc degeneration, unspecified cervical region: Secondary | ICD-10-CM | POA: Diagnosis not present

## 2023-03-23 DIAGNOSIS — M5020 Other cervical disc displacement, unspecified cervical region: Secondary | ICD-10-CM | POA: Diagnosis not present

## 2023-03-23 DIAGNOSIS — M5412 Radiculopathy, cervical region: Secondary | ICD-10-CM | POA: Diagnosis not present

## 2023-04-19 ENCOUNTER — Ambulatory Visit: Payer: Medicare PPO | Admitting: Neurology

## 2023-04-28 DIAGNOSIS — M542 Cervicalgia: Secondary | ICD-10-CM | POA: Diagnosis not present

## 2023-05-04 DIAGNOSIS — H401131 Primary open-angle glaucoma, bilateral, mild stage: Secondary | ICD-10-CM | POA: Diagnosis not present

## 2023-05-04 DIAGNOSIS — H1789 Other corneal scars and opacities: Secondary | ICD-10-CM | POA: Diagnosis not present

## 2023-05-07 DIAGNOSIS — G5603 Carpal tunnel syndrome, bilateral upper limbs: Secondary | ICD-10-CM | POA: Diagnosis not present

## 2023-05-12 ENCOUNTER — Encounter: Payer: Self-pay | Admitting: Podiatry

## 2023-05-12 ENCOUNTER — Ambulatory Visit: Payer: Medicare PPO | Admitting: Podiatry

## 2023-05-12 DIAGNOSIS — B351 Tinea unguium: Secondary | ICD-10-CM

## 2023-05-12 DIAGNOSIS — M79674 Pain in right toe(s): Secondary | ICD-10-CM | POA: Diagnosis not present

## 2023-05-12 DIAGNOSIS — E119 Type 2 diabetes mellitus without complications: Secondary | ICD-10-CM

## 2023-05-12 DIAGNOSIS — M79675 Pain in left toe(s): Secondary | ICD-10-CM | POA: Diagnosis not present

## 2023-05-13 ENCOUNTER — Telehealth: Payer: Self-pay | Admitting: Podiatry

## 2023-05-13 NOTE — Telephone Encounter (Signed)
Patient called.  States Dr. Logan Bores removed a toenail yesterday.  She was given medication (topical) by [facility] and had been applying it for 6-8 months, but since the nail had to be removed, she is wondering if there is something else she needs to do to make sure it doesn't happen again?  She asked about keeping foot closed up in shoe or not?  Since that medicine didn't work, she just wants to know what she should do to keep it from happening again.  Callback # [phone]

## 2023-05-14 NOTE — Telephone Encounter (Signed)
Tried calling patient.  No answer.  Left a voicemail.  Recommend triple antibiotic or Neosporin and a Band-Aid.-Dr. Logan Bores

## 2023-05-20 DIAGNOSIS — M542 Cervicalgia: Secondary | ICD-10-CM | POA: Diagnosis not present

## 2023-05-24 ENCOUNTER — Other Ambulatory Visit: Payer: Self-pay | Admitting: Podiatry

## 2023-05-24 MED ORDER — CICLOPIROX 8 % EX SOLN: Freq: Every day | CUTANEOUS | 2 refills | Status: AC

## 2023-05-24 NOTE — Progress Notes (Signed)
PRN onychomycosis of toenails

## 2023-05-24 NOTE — Telephone Encounter (Signed)
Pt came in to the office and she was wanting to know if she should continue to use medication Ciclopirox topical Solution that was given to her by [facility] and she was in need of more if Dr. Logan Bores would like for her to continue on it.  Pt would like to know if she should use the recommendation that was given by Dr Logan Bores on 05/14/2023.  Pt stated that she is sorry that she did not receive the call.    Pt would like to have a call.

## 2023-05-31 NOTE — Progress Notes (Signed)
   Chief Complaint  Patient presents with   Toe Pain    Rm9: NP toenail thickness on the right greater toe    SUBJECTIVE Patient presents to office today complaining of elongated, thickened nails that cause pain while ambulating in shoes.  Patient is unable to trim their own nails. Patient is here for further evaluation and treatment.  Past Medical History:  Diagnosis Date   Diabetes mellitus without complication (HCC)    Hypertension    Pinched nerve in neck    Seasonal allergies     Allergies  Allergen Reactions   Align [Acidophilus] Other (See Comments)    GI upset--"stomach problems" & weakness   Clindamycin/Lincomycin Diarrhea    GI upset--"stomach problems"   Ramipril Other (See Comments)    GI upset--"stomach problems"   Red Yeast Rice [Monascus Purpureus Went Yeast] Other (See Comments)    GI upset--"stomach problems"   Amoxicillin Rash    Has patient had a PCN reaction causing immediate rash, facial/tongue/throat swelling, SOB or lightheadedness with hypotension: Yes Has patient had a PCN reaction causing severe rash involving mucus membranes or skin necrosis: No Has patient had a PCN reaction that required hospitalization: No Has patient had a PCN reaction occurring within the last 10 years: No If all of the above answers are "NO", then may proceed with Cephalosporin use.      OBJECTIVE General Patient is awake, alert, and oriented x 3 and in no acute distress. Derm Skin is dry and supple bilateral. Negative open lesions or macerations. Remaining integument unremarkable. Nails are tender, long, thickened and dystrophic with subungual debris, consistent with onychomycosis, 1-5 bilateral. No signs of infection noted. Vasc  DP and PT pedal pulses palpable bilaterally. Temperature gradient within normal limits.  Neuro Epicritic and protective threshold sensation grossly intact bilaterally.  Musculoskeletal Exam No symptomatic pedal deformities noted bilateral.  Muscular strength within normal limits.  ASSESSMENT 1.  Pain due to onychomycosis of toenails both 2.  Encounter for diabetic foot exam  PLAN OF CARE 1. Patient evaluated today.  Comprehensive diabetic foot exam performed today 2. Instructed to maintain good pedal hygiene and foot care.  3. Mechanical debridement of nails 1-5 bilaterally performed using a nail nipper. Filed with dremel without incident.  4. Return to clinic in 3 mos.    Felecia Shelling, DPM [facility]  Dr. Felecia Shelling, DPM    [address] [place], [place] [address]                Office [phone]  Fax [phone]

## 2023-06-01 DIAGNOSIS — M542 Cervicalgia: Secondary | ICD-10-CM | POA: Diagnosis not present

## 2023-06-02 DIAGNOSIS — B351 Tinea unguium: Secondary | ICD-10-CM | POA: Diagnosis not present

## 2023-06-02 DIAGNOSIS — M5412 Radiculopathy, cervical region: Secondary | ICD-10-CM | POA: Diagnosis not present

## 2023-06-09 DIAGNOSIS — M542 Cervicalgia: Secondary | ICD-10-CM | POA: Diagnosis not present

## 2023-06-22 DIAGNOSIS — G3184 Mild cognitive impairment, so stated: Secondary | ICD-10-CM | POA: Diagnosis not present

## 2023-06-22 DIAGNOSIS — M5412 Radiculopathy, cervical region: Secondary | ICD-10-CM | POA: Diagnosis not present

## 2023-06-22 DIAGNOSIS — M5030 Other cervical disc degeneration, unspecified cervical region: Secondary | ICD-10-CM | POA: Diagnosis not present

## 2023-06-23 ENCOUNTER — Ambulatory Visit: Payer: Medicare PPO | Admitting: Podiatry

## 2023-06-25 ENCOUNTER — Other Ambulatory Visit: Payer: Self-pay

## 2023-06-25 DIAGNOSIS — I10 Essential (primary) hypertension: Secondary | ICD-10-CM

## 2023-06-25 MED ORDER — LOSARTAN POTASSIUM 25 MG PO TABS: 25.0000 mg | ORAL_TABLET | Freq: Two times a day (BID) | ORAL | 0 refills | Status: DC

## 2023-06-30 DIAGNOSIS — G5603 Carpal tunnel syndrome, bilateral upper limbs: Secondary | ICD-10-CM | POA: Diagnosis not present

## 2023-07-05 ENCOUNTER — Encounter: Payer: Self-pay | Admitting: Podiatry

## 2023-07-05 ENCOUNTER — Ambulatory Visit: Payer: Medicare PPO | Admitting: Podiatry

## 2023-07-05 VITALS — Ht 61.0 in | Wt 129.0 lb

## 2023-07-05 DIAGNOSIS — M79674 Pain in right toe(s): Secondary | ICD-10-CM

## 2023-07-05 DIAGNOSIS — M79675 Pain in left toe(s): Secondary | ICD-10-CM | POA: Diagnosis not present

## 2023-07-05 DIAGNOSIS — B351 Tinea unguium: Secondary | ICD-10-CM | POA: Diagnosis not present

## 2023-07-05 NOTE — Progress Notes (Signed)
   Chief Complaint  Patient presents with   Ingrown Toenail    RM6: patient is here for F/U for ingrown toenail    HPI: 82 y.o. female presenting today for follow-up evaluation of nail dystrophy to the right hallux nail plate.  Patient states that she is unsure how long to apply the topical antifungal for.  No pain to the toenail.  Past Medical History:  Diagnosis Date   Diabetes mellitus without complication (HCC)    Hypertension    Pinched nerve in neck    Seasonal allergies     Past Surgical History:  Procedure Laterality Date   COLONOSCOPY     DILATATION & CURETTAGE/HYSTEROSCOPY WITH MYOSURE N/A 05/20/2017   Procedure: DILATATION & CURETTAGE/HYSTEROSCOPY WITH MYOSURE;  Surgeon: Myna Hidalgo, DO;  Location: [facility];  Service: Gynecology;  Laterality: N/A;  Polypectomy   DILATION AND CURETTAGE OF UTERUS      Allergies  Allergen Reactions   Align [Acidophilus] Other (See Comments)    GI upset--"stomach problems" & weakness   Clindamycin/Lincomycin Diarrhea    GI upset--"stomach problems"   Ramipril Other (See Comments)    GI upset--"stomach problems"   Red Yeast Rice [Monascus Purpureus Went Yeast] Other (See Comments)    GI upset--"stomach problems"   Amoxicillin Rash    Has patient had a PCN reaction causing immediate rash, facial/tongue/throat swelling, SOB or lightheadedness with hypotension: Yes Has patient had a PCN reaction causing severe rash involving mucus membranes or skin necrosis: No Has patient had a PCN reaction that required hospitalization: No Has patient had a PCN reaction occurring within the last 10 years: No If all of the above answers are "NO", then may proceed with Cephalosporin use.      Physical Exam: General: The patient is alert and oriented x3 in no acute distress.  Dermatology: Skin is warm, dry and supple bilateral lower extremities.  Dystrophic nail noted to the right hallux nail plate which was debrided last visit on  05/12/2023.  Vascular: Palpable pedal pulses bilaterally. Capillary refill within normal limits.  No appreciable edema.  No erythema.  Neurological: Grossly intact via light touch  Musculoskeletal Exam: No pedal deformities noted.  Patient ambulatory  Assessment/Plan of Care: 1.  Onychomycosis of toenail right hallux nail plate  -Patient evaluated -Continue topical ciclopirox 8% daily -Continue to apply as the nail grows out -Return to clinic as needed       Felecia Shelling, DPM [facility]  Dr. Felecia Shelling, DPM    [address] [place], [place] [address]                Office [phone]  Fax [phone]

## 2023-07-12 DIAGNOSIS — M5412 Radiculopathy, cervical region: Secondary | ICD-10-CM | POA: Diagnosis not present

## 2023-07-12 DIAGNOSIS — M542 Cervicalgia: Secondary | ICD-10-CM | POA: Diagnosis not present

## 2023-07-15 DIAGNOSIS — H401131 Primary open-angle glaucoma, bilateral, mild stage: Secondary | ICD-10-CM | POA: Diagnosis not present

## 2023-07-30 DIAGNOSIS — E7800 Pure hypercholesterolemia, unspecified: Secondary | ICD-10-CM | POA: Diagnosis not present

## 2023-07-30 DIAGNOSIS — I10 Essential (primary) hypertension: Secondary | ICD-10-CM | POA: Diagnosis not present

## 2023-07-30 DIAGNOSIS — E1165 Type 2 diabetes mellitus with hyperglycemia: Secondary | ICD-10-CM | POA: Diagnosis not present

## 2023-07-30 DIAGNOSIS — Z0000 Encounter for general adult medical examination without abnormal findings: Secondary | ICD-10-CM | POA: Diagnosis not present

## 2023-07-30 DIAGNOSIS — E559 Vitamin D deficiency, unspecified: Secondary | ICD-10-CM | POA: Diagnosis not present

## 2023-07-30 DIAGNOSIS — Z79899 Other long term (current) drug therapy: Secondary | ICD-10-CM | POA: Diagnosis not present

## 2023-07-31 ENCOUNTER — Other Ambulatory Visit: Payer: Self-pay | Admitting: Family

## 2023-07-31 DIAGNOSIS — I10 Essential (primary) hypertension: Secondary | ICD-10-CM

## 2023-08-09 DIAGNOSIS — G3184 Mild cognitive impairment, so stated: Secondary | ICD-10-CM | POA: Diagnosis not present

## 2023-08-09 DIAGNOSIS — M5010 Cervical disc disorder with radiculopathy, unspecified cervical region: Secondary | ICD-10-CM | POA: Diagnosis not present

## 2023-08-16 DIAGNOSIS — H04123 Dry eye syndrome of bilateral lacrimal glands: Secondary | ICD-10-CM | POA: Diagnosis not present

## 2023-08-16 DIAGNOSIS — H43811 Vitreous degeneration, right eye: Secondary | ICD-10-CM | POA: Diagnosis not present

## 2023-08-16 DIAGNOSIS — H401132 Primary open-angle glaucoma, bilateral, moderate stage: Secondary | ICD-10-CM | POA: Diagnosis not present

## 2023-08-16 DIAGNOSIS — H2511 Age-related nuclear cataract, right eye: Secondary | ICD-10-CM | POA: Diagnosis not present

## 2023-08-16 DIAGNOSIS — H35342 Macular cyst, hole, or pseudohole, left eye: Secondary | ICD-10-CM | POA: Diagnosis not present

## 2023-08-26 DIAGNOSIS — G3184 Mild cognitive impairment, so stated: Secondary | ICD-10-CM | POA: Diagnosis not present

## 2023-09-13 DIAGNOSIS — M542 Cervicalgia: Secondary | ICD-10-CM | POA: Diagnosis not present

## 2023-09-13 DIAGNOSIS — M5412 Radiculopathy, cervical region: Secondary | ICD-10-CM | POA: Diagnosis not present

## 2023-09-20 ENCOUNTER — Other Ambulatory Visit: Payer: Self-pay | Admitting: Family

## 2023-09-20 DIAGNOSIS — I10 Essential (primary) hypertension: Secondary | ICD-10-CM

## 2023-09-21 DIAGNOSIS — H401132 Primary open-angle glaucoma, bilateral, moderate stage: Secondary | ICD-10-CM | POA: Diagnosis not present

## 2023-10-05 ENCOUNTER — Ambulatory Visit: Payer: Medicaid Other | Admitting: Neurology

## 2023-10-05 ENCOUNTER — Encounter: Payer: Self-pay | Admitting: Neurology

## 2023-10-05 VITALS — BP 199/73 | HR 67 | Ht 62.0 in | Wt 126.0 lb

## 2023-10-05 DIAGNOSIS — R4189 Other symptoms and signs involving cognitive functions and awareness: Secondary | ICD-10-CM

## 2023-10-05 NOTE — Progress Notes (Signed)
Patient provided me with her medication list which was updated to the best of her knowledge.

## 2023-10-05 NOTE — Progress Notes (Signed)
[facility]  PATIENT: Barbara Rodriguez DOB: 07/26/1941  REQUESTING CLINICIAN: Mila Palmer, MD HISTORY FROM: Patient  REASON FOR VISIT: Memory loss    HISTORICAL  CHIEF COMPLAINT:  Chief Complaint  Patient presents with   New Patient (Initial Visit)    Rm 12. Alone. MOCA NP Paper referral for Dementia / Mila Palmer MD [facility]    HISTORY OF PRESENT ILLNESS:  This 83 year old woman past medical history of hypertension, hyperlipidemia, diabetes who was referred for PCP for memory problem.  Patient tells me that sometimes she is forgetful but thinks this is normal for a 83 year old woman.  She is independent, lives with her husband, she is doing the cooking, cleaning, has no issue taking care of herself.  She reports driving, denies any recent accident or being lost in familiar places.  Denies forgetting names of family members, reports that her sleep is good.  She is alone today therefore unable to get any type of collateral history. During our visit she was perseverating and repeating the fact that the nurse working with her PCP was not happy with her going to urgent care and she has left shoulder pain.  She repeated that multiple times.  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   TBI:  No past history of TBI Stroke:  no past history of stroke Seizures:   no past history of seizures Sleep: no history of sleep apnea.  Mood: patient denies anxiety and depression Family history of Dementia: Denies  Functional status: independent in all ADLs and IADLs Patient lives with husband. Cooking: yes Cleaning: yes Shopping: yes Bathing: no issues  Toileting: no issues  Driving:  yes, denies recent accident  Bills: No issues  Medications: no issues  Ever left the stove on by accident?: Denies  Forget how to use items around the house?: Denies  Getting lost going to familiar places?: Denies  Forgetting loved ones names?: Denies  Word finding difficulty? Denies  Sleep: Good    OTHER MEDICAL CONDITIONS: Hypertension, Hyperlipidemia, Diabetes, Seasonal allergy, left shoulder pain    REVIEW OF SYSTEMS: Full 14 system review of systems performed and negative with exception of: As noted in the HPI   ALLERGIES: Allergies  Allergen Reactions   Align [Bacid] Other (See Comments)    GI upset--"stomach problems" & weakness   Clindamycin/Lincomycin Diarrhea    GI upset--"stomach problems"   Erythromycin Other (See Comments)    indigestion   Prednisone Other (See Comments)    jittery   Ramipril Other (See Comments)    GI upset--"stomach problems"   Red Yeast Rice Extract Other (See Comments)    Muscle aches   Red Yeast Rice [Monascus Purpureus Went Yeast] Other (See Comments)    GI upset--"stomach problems"   Tizanidine Other (See Comments)    Drowsy, dizzy, funny feeling   Vitamin B12 Diarrhea   Amoxicillin Rash    Has patient had a PCN reaction causing immediate rash, facial/tongue/throat swelling, SOB or lightheadedness with hypotension: Yes Has patient had a PCN reaction causing severe rash involving mucus membranes or skin necrosis: No Has patient had a PCN reaction that required hospitalization: No Has patient had a PCN reaction occurring within the last  10 years: No If all of the above answers are "NO", then may proceed with Cephalosporin use.     HOME MEDICATIONS: Outpatient Medications Prior to Visit  Medication Sig Dispense Refill   ACCU-CHEK GUIDE TEST test strip USE TO TEST BLOOD SUGAR 2 TO 3 TIMES A DAY AS DIRECTED     Blood Glucose Monitoring Suppl (ACCU-CHEK GUIDE) w/Device KIT USE TO CHECK BLOOD GLUCOSE 2-3 TIMES DAILY     ciclopirox  (PENLAC) 8 % solution Apply topically at bedtime. Apply over nail and surrounding skin. Apply daily over previous coat. After seven (7) days, may remove with alcohol and continue cycle. 6.6 mL 2   ECHINACEA EXTRACT PO Take by mouth daily as needed.     fexofenadine (ALLEGRA) 180 MG tablet Take 180 mg by mouth daily as needed for allergies or rhinitis.     latanoprost (XALATAN) 0.005 % ophthalmic solution 1 drop at bedtime.     losartan (COZAAR) 25 MG tablet Take 1 tablet (25 mg total) by mouth in the morning and at bedtime. PLEASE CALL [phone] TO SCHEDULE AN OVERDUE VISIT FOR FURTHER REFILLS. 30 tablet 0   metFORMIN (GLUCOPHAGE) 500 MG tablet Take 1 tablet in the morning and 1/2 tablet in the evening     No facility-administered medications prior to visit.    PAST MEDICAL HISTORY: Past Medical History:  Diagnosis Date   Diabetes mellitus without complication (HCC)    Hypertension    Pinched nerve in neck    Seasonal allergies     PAST SURGICAL HISTORY: Past Surgical History:  Procedure Laterality Date   COLONOSCOPY     DILATATION & CURETTAGE/HYSTEROSCOPY WITH MYOSURE N/A 05/20/2017   Procedure: DILATATION & CURETTAGE/HYSTEROSCOPY WITH MYOSURE;  Surgeon: Myna Hidalgo, DO;  Location: [facility];  Service: Gynecology;  Laterality: N/A;  Polypectomy   DILATION AND CURETTAGE OF UTERUS      FAMILY HISTORY: Family History  Problem Relation Age of Onset   Thyroid disease Mother    Diabetes Maternal Aunt     SOCIAL HISTORY: Social History   Socioeconomic History   Marital status: Married    Spouse name: Not on file   Number of children: Not on file   Years of education: Not on file   Highest education level: Not on file  Occupational History   Not on file  Tobacco Use   Smoking status: Former    Current packs/day: 0.00    Types: Cigarettes    Quit date: 08/19/1983    Years since quitting: 40.1   Smokeless tobacco: Never  Substance and Sexual Activity   Alcohol use: No    Drug use: No   Sexual activity: Not on file  Other Topics Concern   Not on file  Social History Narrative   Not on file   Social Drivers of Health   Financial Resource Strain: Not on file  Food Insecurity: No Food Insecurity (06/18/2022)   Hunger Vital Sign    Worried About Running Out of Food in the Last Year: Never true    Ran Out of Food in the Last Year: Never true  Transportation Needs: No Transportation Needs (06/18/2022)   PRAPARE - Administrator, Civil Service (Medical): No    Lack of Transportation (Non-Medical): No  Physical Activity: Not on file  Stress: Not on file  Social Connections: Unknown (06/18/2022)   Social Connection and Isolation Panel [NHANES]    Frequency of Communication with Friends and Family: Once a week  Frequency of Social Gatherings with Friends and Family: Once a week    Attends Religious Services: 1 to 4 times per year    Active Member of [employer] or Organizations: Not on file    Attends Banker Meetings: Not on file    Marital Status: Not on file  Intimate Partner Violence: Not on file    PHYSICAL EXAM  GENERAL EXAM/CONSTITUTIONAL: Vitals:  Vitals:   10/05/23 1302 10/05/23 1317  BP: (!) 189/70 (!) 199/73  Pulse: 67 67  Weight: 126 lb (57.2 kg)   Height: 5\' 2"  (1.575 m)    Body mass index is 23.05 kg/m. Wt Readings from Last 3 Encounters:  10/05/23 126 lb (57.2 kg)  07/05/23 129 lb (58.5 kg)  06/18/22 129 lb (58.5 kg)   Patient is in no distress; well developed, nourished and groomed; neck is supple  MUSCULOSKELETAL: Gait, strength, tone, movements noted in Neurologic exam below  NEUROLOGIC: MENTAL STATUS:      No data to display            10/05/2023    1:09 PM  Montreal Cognitive Assessment   Visuospatial/ Executive (0/5) 3  Naming (0/3) 3  Attention: Read list of digits (0/2) 2  Attention: Read list of letters (0/1) 1  Attention: Serial 7 subtraction starting at 100 (0/3) 1  Language:  Repeat phrase (0/2) 2  Language : Fluency (0/1) 0  Abstraction (0/2) 2  Delayed Recall (0/5) 0  Orientation (0/6) 5  Total 19  Adjusted Score (based on education) 19     CRANIAL NERVE:  2nd, 3rd, 4th, 6th- visual fields full to confrontation, extraocular muscles intact, no nystagmus 5th - facial sensation symmetric 7th - facial strength symmetric 8th - hearing intact 9th - palate elevates symmetrically, uvula midline 11th - shoulder shrug symmetric 12th - tongue protrusion midline  MOTOR:  normal bulk and tone, full strength in the BUE, BLE  SENSORY:  normal and symmetric to light touch  COORDINATION:  finger-nose-finger, fine finger movements normal  GAIT/STATION:  normal   DIAGNOSTIC DATA (LABS, IMAGING, TESTING) - I reviewed patient records, labs, notes, testing and imaging myself where available.  Lab Results  Component Value Date   WBC 5.6 01/11/2019   HGB 12.6 01/11/2019   HCT 39.3 01/11/2019   MCV 81.7 01/11/2019   PLT 243 01/11/2019      Component Value Date/Time   NA 140 01/11/2019 1608   K 4.2 01/11/2019 1608   CL 105 01/11/2019 1608   CO2 26 01/11/2019 1608   GLUCOSE 143 (H) 01/11/2019 1608   BUN 8 01/11/2019 1608   CREATININE 0.80 01/11/2019 1608   CALCIUM 9.2 01/11/2019 1608   PROT 6.5 01/11/2019 1608   ALBUMIN 3.7 01/11/2019 1608   AST 20 01/11/2019 1608   ALT 13 01/11/2019 1608   ALKPHOS 77 01/11/2019 1608   BILITOT 0.7 01/11/2019 1608   GFRNONAA >60 01/11/2019 1608   GFRAA >60 01/11/2019 1608   No results found for: "CHOL", "HDL", "LDLCALC", "LDLDIRECT", "TRIG", "CHOLHDL" No results found for: "HGBA1C" No results found for: "VITAMINB12" No results found for: "TSH"    ASSESSMENT AND PLAN  83 y.o. year old female with history of hypertension, diabetes mellitus who was referred by PCP for memory loss.  Patient was alone, therefore unable to get any collateral history.  On examination, she was noted to be perseverating and repeating  herself.  She scored 19 out of 30 on the MMSE indicative of impairment.  I suspect patient has mild cognitive impairment versus early onset dementia.  Unable to obtain additional history, she tells me that her daughter lives in [place].  Plan will be to obtain the dementia lab including B12, TSH and ATN profile to look for Alzheimer disease biomarker.  We will also obtain a head CT.  I will call her to go over the result but I did strongly advised her to present with the family member for follow-up she voiced understanding.  Continue to follow with Dr. Paulino Rily and return in 1 year or sooner if worse.   1. Cognitive impairment      Patient Instructions  Dementia work up including TSH, B12 and ATN profile  Head CT  Continue current medications  Continue to follow up with PCP  Return in a year. Please come to the office with family member   There are well-accepted and sensible ways to reduce risk for Alzheimers disease and other degenerative brain disorders .  Exercise Daily Walk A daily 20 minute walk should be part of your routine. Disease related apathy can be a significant roadblock to exercise and the only way to overcome this is to make it a daily routine and perhaps have a reward at the end (something your loved one loves to eat or drink perhaps) or a personal trainer coming to the home can also be very useful. Most importantly, the patient is much more likely to exercise if the caregiver / spouse does it with him/her. In general a structured, repetitive schedule is best.  General Health: Any diseases which effect your body will effect your brain such as a pneumonia, urinary infection, blood clot, heart attack or stroke. Keep contact with your primary care doctor for regular follow ups.  Sleep. A good nights sleep is healthy for the brain. Seven hours is recommended. If you have insomnia or poor sleep habits we can give you some instructions. If you have sleep apnea wear your mask.  Diet: Eating  a heart healthy diet is also a good idea; fish and poultry instead of red meat, nuts (mostly non-peanuts), vegetables, fruits, olive oil or canola oil (instead of butter), minimal salt (use other spices to flavor foods), whole grain rice, bread, cereal and pasta and wine in moderation.Research is now showing that the MIND diet, which is a combination of The Mediterranean diet and the DASH diet, is beneficial for cognitive processing and longevity. Information about this diet can be found in The MIND Diet, a book by Alonna Minium, MS, RDN, and online at [employer]Wild[employer].es  Finances, Power of [address] and Advance Directives: You should consider putting legal safeguards in place with regard to financial and medical decision making. While the spouse always has power of attorney for medical and financial issues in the absence of any form, you should consider what you want in case the spouse / caregiver is no longer around or capable of making decisions.   Orders Placed This Encounter  Procedures   CT HEAD WO CONTRAST ( )   Vitamin B12   TSH   ATN PROFILE    No orders of the defined types were placed in this encounter.   Return in about 1 year (around 10/04/2024).  I have spent a total of 65 minutes dedicated to this patient today, preparing to see patient, performing a medically appropriate examination and evaluation, ordering tests and/or medications and procedures, and counseling and educating the patient/family/caregiver; independently interpreting result and communicating results to the family/patient/caregiver; and documenting clinical information  in the electronic medical record.   Windell Norfolk, MD 10/05/2023, 5:16 PM  [facility] [address], [address] [place], [place] [address] [phone]

## 2023-10-05 NOTE — Patient Instructions (Signed)
Dementia work up including TSH, B12 and ATN profile  Head CT  Continue current medications  Continue to follow up with PCP  Return in a year. Please come to the office with family member   There are well-accepted and sensible ways to reduce risk for Alzheimers disease and other degenerative brain disorders .  Exercise Daily Walk A daily 20 minute walk should be part of your routine. Disease related apathy can be a significant roadblock to exercise and the only way to overcome this is to make it a daily routine and perhaps have a reward at the end (something your loved one loves to eat or drink perhaps) or a personal trainer coming to the home can also be very useful. Most importantly, the patient is much more likely to exercise if the caregiver / spouse does it with him/her. In general a structured, repetitive schedule is best.  General Health: Any diseases which effect your body will effect your brain such as a pneumonia, urinary infection, blood clot, heart attack or stroke. Keep contact with your primary care doctor for regular follow ups.  Sleep. A good nights sleep is healthy for the brain. Seven hours is recommended. If you have insomnia or poor sleep habits we can give you some instructions. If you have sleep apnea wear your mask.  Diet: Eating a heart healthy diet is also a good idea; fish and poultry instead of red meat, nuts (mostly non-peanuts), vegetables, fruits, olive oil or canola oil (instead of butter), minimal salt (use other spices to flavor foods), whole grain rice, bread, cereal and pasta and wine in moderation.Research is now showing that the MIND diet, which is a combination of The Mediterranean diet and the DASH diet, is beneficial for cognitive processing and longevity. Information about this diet can be found in The MIND Diet, a book by Alonna Minium, MS, RDN, and online at [employer].es  Finances, Power of [address] and Advance Directives: You  should consider putting legal safeguards in place with regard to financial and medical decision making. While the spouse always has power of attorney for medical and financial issues in the absence of any form, you should consider what you want in case the spouse / caregiver is no longer around or capable of making decisions.

## 2023-10-06 ENCOUNTER — Telehealth: Payer: Self-pay | Admitting: Neurology

## 2023-10-06 NOTE — Telephone Encounter (Signed)
Ethlyn Gallery: [phone] exp. 10/06/23-12/05/23 sent to GI [phone]

## 2023-10-07 ENCOUNTER — Telehealth: Payer: Self-pay | Admitting: Anesthesiology

## 2023-10-07 DIAGNOSIS — J309 Allergic rhinitis, unspecified: Secondary | ICD-10-CM | POA: Diagnosis not present

## 2023-10-07 DIAGNOSIS — I10 Essential (primary) hypertension: Secondary | ICD-10-CM | POA: Diagnosis not present

## 2023-10-07 DIAGNOSIS — R0989 Other specified symptoms and signs involving the circulatory and respiratory systems: Secondary | ICD-10-CM | POA: Diagnosis not present

## 2023-10-08 LAB — ATN PROFILE
A -- Beta-amyloid 42/40 Ratio: 0.089 — ABNORMAL LOW (ref >0.102)
Beta-amyloid 40: 223.89 pg/mL
Beta-amyloid 42: 19.97 pg/mL
N -- NfL, Plasma: 3.86 pg/mL (ref 0.00–11.55)
T -- p-tau181: 1.34 pg/mL — ABNORMAL HIGH (ref 0.00–0.97)

## 2023-10-08 LAB — TSH: TSH: 1.58 u[IU]/mL (ref 0.450–4.500)

## 2023-10-08 LAB — VITAMIN B12: Vitamin B-12: 558 pg/mL (ref 232–1245)

## 2023-10-13 ENCOUNTER — Telehealth: Payer: Self-pay

## 2023-10-13 MED ORDER — DONEPEZIL HCL 10 MG PO TABS: 10.0000 mg | ORAL_TABLET | Freq: Every day | ORAL | 1 refills | Status: DC

## 2023-10-13 NOTE — Telephone Encounter (Signed)
 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   PCP: Dr. Verena: pt called back stating she thought she was being referred to [facility] for memory impairment and pinched nerve pain. Pt stated that she has left shoulder pain but it started from pain in eyes that radiates down through shoulder. Pt feels that the referral should've included being seen for the pain as well and seems highly upset. I advised to call pcp and ask why referral wasn't sent for the pain but she stated that she was seeing [facility]  All in all, the pt seemed highly confused

## 2023-10-13 NOTE — Telephone Encounter (Signed)
Medication sent. Pt advised that ortho should be handling pain

## 2023-10-13 NOTE — Addendum Note (Signed)
 Addended by: Randi Buster on: 10/13/2023 02:55 PM   Modules accepted: Orders

## 2023-10-13 NOTE — Telephone Encounter (Signed)
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 We can start a medication called Aricept  to slow down the progression of the disease. Side effect of the medication include dizziness, diarrhea and vivid dream.  Please remind patient to keep any upcoming appointments or tests and to call us  with any interim questions, concerns, problems or updates. Thanks,   Pastor Falling, MD

## 2023-10-13 NOTE — Telephone Encounter (Signed)
 Patient called.  Patient aware. Patient stated that they would like to discuss adding medication with family and they will call us  back and let us  know should they be willing to start.

## 2023-10-19 DIAGNOSIS — F02A4 Dementia in other diseases classified elsewhere, mild, with anxiety: Secondary | ICD-10-CM | POA: Diagnosis not present

## 2023-10-19 DIAGNOSIS — G301 Alzheimer's disease with late onset: Secondary | ICD-10-CM | POA: Diagnosis not present

## 2023-10-24 ENCOUNTER — Other Ambulatory Visit: Payer: Self-pay | Admitting: Family

## 2023-10-24 DIAGNOSIS — I10 Essential (primary) hypertension: Secondary | ICD-10-CM

## 2023-11-03 DIAGNOSIS — G3184 Mild cognitive impairment, so stated: Secondary | ICD-10-CM | POA: Diagnosis not present

## 2023-11-03 DIAGNOSIS — E1165 Type 2 diabetes mellitus with hyperglycemia: Secondary | ICD-10-CM | POA: Diagnosis not present

## 2023-11-05 NOTE — Telephone Encounter (Signed)
 Pt is asking for a call from RN to discuss concerns her family has about her starting  donepezil (ARICEPT) 10 MG tablet

## 2023-11-08 NOTE — Telephone Encounter (Signed)
 Call to patient, she wanted to let us know she has not started the Aricpet due to the side effects she read and wanted to try something over the counter called Brain Savior. I advised I would let Dr Teresa Coombs know. She also spoke about her arm and neck pain and I advised she would need to get a referral. She was in agreement.

## 2023-11-08 NOTE — Telephone Encounter (Signed)
 Thank you for the update!

## 2023-11-10 DIAGNOSIS — H18593 Other hereditary corneal dystrophies, bilateral: Secondary | ICD-10-CM | POA: Diagnosis not present

## 2023-11-10 DIAGNOSIS — H1820 Unspecified corneal edema: Secondary | ICD-10-CM | POA: Diagnosis not present

## 2023-11-11 ENCOUNTER — Other Ambulatory Visit: Payer: Self-pay

## 2023-11-13 ENCOUNTER — Encounter (HOSPITAL_BASED_OUTPATIENT_CLINIC_OR_DEPARTMENT_OTHER): Payer: Self-pay | Admitting: Emergency Medicine

## 2023-11-13 ENCOUNTER — Emergency Department (HOSPITAL_BASED_OUTPATIENT_CLINIC_OR_DEPARTMENT_OTHER)

## 2023-11-13 ENCOUNTER — Other Ambulatory Visit: Payer: Self-pay

## 2023-11-13 ENCOUNTER — Telehealth: Payer: Self-pay | Admitting: Neurology

## 2023-11-13 ENCOUNTER — Emergency Department (HOSPITAL_BASED_OUTPATIENT_CLINIC_OR_DEPARTMENT_OTHER)
Admission: EM | Admit: 2023-11-13 | Discharge: 2023-11-13 | Disposition: A | Discharge status: Home / Self Care | Attending: Emergency Medicine | Admitting: Emergency Medicine

## 2023-11-13 ENCOUNTER — Emergency Department (HOSPITAL_BASED_OUTPATIENT_CLINIC_OR_DEPARTMENT_OTHER): Admitting: Radiology

## 2023-11-13 DIAGNOSIS — R030 Elevated blood-pressure reading, without diagnosis of hypertension: Secondary | ICD-10-CM

## 2023-11-13 DIAGNOSIS — I10 Essential (primary) hypertension: Secondary | ICD-10-CM | POA: Diagnosis not present

## 2023-11-13 DIAGNOSIS — M542 Cervicalgia: Secondary | ICD-10-CM | POA: Diagnosis present

## 2023-11-13 DIAGNOSIS — Z7984 Long term (current) use of oral hypoglycemic drugs: Secondary | ICD-10-CM | POA: Diagnosis not present

## 2023-11-13 DIAGNOSIS — Z6822 Body mass index (BMI) 22.0-22.9, adult: Secondary | ICD-10-CM | POA: Diagnosis not present

## 2023-11-13 DIAGNOSIS — M79602 Pain in left arm: Secondary | ICD-10-CM | POA: Diagnosis not present

## 2023-11-13 DIAGNOSIS — E119 Type 2 diabetes mellitus without complications: Secondary | ICD-10-CM | POA: Diagnosis not present

## 2023-11-13 DIAGNOSIS — Z79899 Other long term (current) drug therapy: Secondary | ICD-10-CM | POA: Insufficient documentation

## 2023-11-13 DIAGNOSIS — R413 Other amnesia: Secondary | ICD-10-CM | POA: Diagnosis not present

## 2023-11-13 DIAGNOSIS — E041 Nontoxic single thyroid nodule: Secondary | ICD-10-CM | POA: Diagnosis not present

## 2023-11-13 DIAGNOSIS — I6523 Occlusion and stenosis of bilateral carotid arteries: Secondary | ICD-10-CM | POA: Diagnosis not present

## 2023-11-13 LAB — CBC
HCT: 38.1 % (ref 36.0–46.0)
Hemoglobin: 12.3 g/dL (ref 12.0–15.0)
MCH: 25.8 pg — ABNORMAL LOW (ref 26.0–34.0)
MCHC: 32.3 g/dL (ref 30.0–36.0)
MCV: 80 fL (ref 80.0–100.0)
Platelets: 283 10*3/uL (ref 150–400)
RBC: 4.76 MIL/uL (ref 3.87–5.11)
RDW: 15.1 % (ref 11.5–15.5)
WBC: 5.3 10*3/uL (ref 4.0–10.5)
nRBC: 0 % (ref 0.0–0.2)

## 2023-11-13 LAB — BASIC METABOLIC PANEL
Anion gap: 8 (ref 5–15)
BUN: 7 mg/dL — ABNORMAL LOW (ref 8–23)
CO2: 28 mmol/L (ref 22–32)
Calcium: 9.2 mg/dL (ref 8.9–10.3)
Chloride: 104 mmol/L (ref 98–111)
Creatinine, Ser: 0.63 mg/dL (ref 0.44–1.00)
GFR, Estimated: >60 mL/min (ref >60)
Glucose, Bld: 103 mg/dL — ABNORMAL HIGH (ref 70–99)
Potassium: 3.7 mmol/L (ref 3.5–5.1)
Sodium: 140 mmol/L (ref 135–145)

## 2023-11-13 LAB — TROPONIN I (HIGH SENSITIVITY): Troponin I (High Sensitivity): 3 ng/L (ref <18)

## 2023-11-13 MED ORDER — IOHEXOL 350 MG/ML SOLN
75.0000 mL | Freq: Once | INTRAVENOUS | Status: AC | PRN
  Administered 2023-11-13: 75 mL via INTRAVENOUS

## 2023-11-13 MED ORDER — LOSARTAN POTASSIUM 25 MG PO TABS
25.0000 mg | ORAL_TABLET | Freq: Once | ORAL | Status: AC
  Administered 2023-11-13: 25 mg via ORAL
  Filled 2023-11-13: qty 1

## 2023-11-13 NOTE — Discharge Instructions (Signed)
 You were evaluated in the emergency room for neck and left upper extremity pain with elevated blood pressure.  Your lab work, EKG and imaging did not show any significant abnormality.  You were given your home blood pressure medication and your blood pressure improved.  Please follow-up with your primary care doctor to further discuss your pressure management.  If you experience any new or worsening symptoms including sudden chest pain, difficulty breathing, dizziness, difficulty walking please return to the emergency room.

## 2023-11-13 NOTE — ED Notes (Signed)
 Patient transported to X-ray

## 2023-11-13 NOTE — ED Provider Notes (Signed)
 [place] EMERGENCY DEPARTMENT AT [facility] Provider Note   CSN: [id number] Arrival date & time: 11/13/23  1434     History  Chief Complaint  Patient presents with   Hypertension    Barbara Rodriguez is a 83 y.o. female with history of diabetes, hypertension, cervical stenosis, mild cognitive impairment presents with elevated blood pressure and left neck and upper extremity yeah but does stop and thank you pain.  Patient denies any chest pain, shortness of breath, blurry vision, dizziness, headache, extremity weakness.  She does occasionally report left neck pain radiates into her left shoulder.  She has a history of cervical stenosis.  Does not appear to be new.  She has no cardiac history no history of blood clots.  Blood pressure was reported to have systolics in the 200s.  Patient is on losartan for hypertension, she did not take her blood pressure today.  She had recent visit with a neurologist for memory decline.  MMSE concerning for mild cognitive impairment.   Hypertension Pertinent negatives include no headaches.   Past Medical History:  Diagnosis Date   Diabetes mellitus without complication (HCC)    Hypertension    Pinched nerve in neck    Seasonal allergies         Home Medications Prior to Admission medications   Medication Sig Start Date End Date Taking? Authorizing Provider  ACCU-CHEK GUIDE TEST test strip USE TO TEST BLOOD SUGAR 2 TO 3 TIMES A DAY AS DIRECTED 07/28/23   [provider]  Blood Glucose Monitoring Suppl (ACCU-CHEK GUIDE) w/Device KIT USE TO CHECK BLOOD GLUCOSE 2-3 TIMES DAILY 07/29/23   [provider]  ciclopirox (PENLAC) 8 % solution Apply topically at bedtime. Apply over nail and surrounding skin. Apply daily over previous coat. After seven (7) days, may remove with alcohol and continue cycle. 05/24/23   Felecia Shelling, DPM  donepezil (ARICEPT) 10 MG tablet Take 1 tablet (10 mg total) by mouth at bedtime. 10/13/23    Windell Norfolk, MD  ECHINACEA EXTRACT PO Take by mouth daily as needed.    [provider]  fexofenadine (ALLEGRA) 180 MG tablet Take 180 mg by mouth daily as needed for allergies or rhinitis.    [provider]  latanoprost (XALATAN) 0.005 % ophthalmic solution 1 drop at bedtime. 07/31/23   [provider]  losartan (COZAAR) 25 MG tablet TAKE 1 TABLET(25 MG) BY MOUTH IN THE MORNING AND AT BEDTIME 10/25/23   Alver Sorrow, NP  metFORMIN (GLUCOPHAGE) 500 MG tablet Take 1 tablet in the morning and 1/2 tablet in the evening    [provider]      Allergies    Align [bacid], Clindamycin/lincomycin, Erythromycin, Prednisone, Ramipril, Red yeast rice extract, Red yeast rice [monascus purpureus went yeast], Tizanidine, Vitamin b12, and Amoxicillin    Review of Systems   Review of Systems  Neurological:  Negative for numbness and headaches.    Physical Exam Updated Vital Signs BP (!) 165/68   Pulse (!) 59   Temp 98 F (36.7 C)   Resp 15   Ht 5\' 2"  (1.575 m)   Wt 57.2 kg   SpO2 100%   BMI 23.06 kg/m  Physical Exam Vitals and nursing note reviewed.  Constitutional:      General: She is not in acute distress.    Appearance: She is well-developed.  HENT:     Head: Normocephalic and atraumatic.  Eyes:     Conjunctiva/sclera: Conjunctivae normal.  Cardiovascular:     Rate and Rhythm: Normal rate and regular rhythm.     Heart sounds: No murmur heard. Pulmonary:     Effort: Pulmonary effort is normal. No respiratory distress.     Breath sounds: Normal breath sounds.  Abdominal:     Palpations: Abdomen is soft.     Tenderness: There is no abdominal tenderness.  Musculoskeletal:        General: No swelling.     Cervical back: Neck supple.  Skin:    General: Skin is warm and dry.     Capillary Refill: Capillary refill takes less than 2 seconds.  Neurological:     Mental Status: She is alert.     Comments: Patient is alert and oriented. There  is no abnormal phonation. Symmetric smile without facial droop. Moves all extremities spontaneously. 5/5 strength in upper and lower extremities. . No sensation deficit. There is no nystagmus. EOMI, PERRL. Coordination intact with finger to nose and normal ambulation.    Psychiatric:        Mood and Affect: Mood normal.     ED Results / Procedures / Treatments   Labs (all labs ordered are listed, but only abnormal results are displayed) Labs Reviewed  BASIC METABOLIC PANEL - Abnormal; Notable for the following components:      Result Value   Glucose, Bld 103 (*)    BUN 7 (*)    All other components within normal limits  CBC - Abnormal; Notable for the following components:   MCH 25.8 (*)    All other components within normal limits  TROPONIN I (HIGH SENSITIVITY)    EKG EKG Interpretation Date/Time:  Saturday November 13 2023 14:51:44 EST Ventricular Rate:  80 PR Interval:  149 QRS Duration:  138 QT Interval:  407 QTC Calculation: 470 R Axis:   4  Text Interpretation: Sinus rhythm Right bundle branch block ST elevation, consider lateral injury no sig change from previous Confirmed by Arby Barrette [id number] on 11/13/2023 5:48:37 PM  Radiology CT ANGIO HEAD NECK W WO CM Result Date: 11/13/2023 CLINICAL DATA:  Headache and neck pain after eye procedure yesterday. Hypertension. EXAM: CT ANGIOGRAPHY HEAD AND NECK WITH AND WITHOUT CONTRAST TECHNIQUE: Multidetector CT imaging of the head and neck was performed using the standard protocol during bolus administration of intravenous contrast. Multiplanar CT image reconstructions and MIPs were obtained to evaluate the vascular anatomy. Carotid stenosis measurements (when applicable) are obtained utilizing NASCET criteria, using the distal internal carotid diameter as the denominator. RADIATION DOSE REDUCTION: This exam was performed according to the departmental dose-optimization program which includes automated exposure control, adjustment of the  mA and/or kV according to patient size and/or use of iterative reconstruction technique. CONTRAST:  75mL OMNIPAQUE IOHEXOL 350 MG/ML SOLN COMPARISON:  CT head without contrast 01/11/2019. FINDINGS: CT HEAD FINDINGS Brain: No acute infarct, hemorrhage, or mass lesion is present. 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 Mild atrophy and white matter changes are within normal limits for age. Deep brain nuclei are within normal limits. The ventricles are of normal size. No significant extraaxial fluid collection is present. The brainstem and cerebellum are within normal limits. Midline structures are within normal limits. Vascular: Atherosclerotic changes are present within the cavernous internal carotid arteries bilaterally. No hyperdense vessel is present. Skull: Calvarium is intact. No focal lytic or blastic lesions are present. No significant extracranial soft tissue lesion is present. Sinuses/Orbits: The paranasal sinuses and mastoid air cells are clear.  Left lens replacement is noted. Globes and orbits are otherwise within normal limits. Review of the MIP images confirms the above findings CTA NECK FINDINGS Aortic arch: A 3 vessel arch configuration is present. No significant atherosclerotic changes present. No stenosis or aneurysm present. Great vessel origins are within normal limits. No dissection is present. Right carotid system: The right common carotid artery is within normal limits. Bifurcation is unremarkable. Cervical right ICA is normal. Left carotid system: The left common carotid artery is within normal limits. Bifurcation is unremarkable. Cervical left ICA is normal. Vertebral arteries: The left vertebral artery is the dominant vessel. Both vertebral arteries originate from the subclavian arteries without significant stenosis. No significant stenosis is present in either vertebral artery in the neck. Skeleton: Vertebral body heights and alignment are normal. Bone detail is  somewhat obscured in lower cervical spine by patient motion. No focal osseous lesions are present. Other neck: The soft tissues of the neck are otherwise unremarkable. Salivary glands are within normal limits. A 2.1 cm left thyroid nodule is noted. No significant adenopathy is present. Upper chest: The lung apices are clear. The thoracic inlet is within normal limits. Review of the MIP images confirms the above findings CTA HEAD FINDINGS Anterior circulation: Atherosclerotic calcifications are present within the cavernous internal carotid arteries bilaterally without significant stenosis. The A1 and M1 segments are normal. No anterior communicating artery is visualized. The MCA bifurcations are within normal limits bilaterally. The ACA and MCA branch vessels are normal bilaterally. No aneurysm is present. Posterior circulation: PICA origins are visualized and normal. A tiny V4 segment is present on the right. The basilar artery is small, centrally terminating at the superior cerebellar artery. Fetal type posterior cerebral arteries are present bilaterally. Moderate P2 segment stenoses are present bilaterally. Distal PCA branch vessels fill bilaterally. Venous sinuses: The dural sinuses are patent. The straight sinus and deep cerebral veins are intact. Cortical veins are within normal limits. No significant vascular malformation is evident. Anatomic variants: Fetal type posterior cerebral arteries bilaterally. Review of the MIP images confirms the above findings IMPRESSION: 1. No acute intracranial abnormality or significant interval change. 2. Remote lacunar infarct of the left thalamus. 3. Moderate P2 segment stenoses bilaterally. 4. Atherosclerotic changes of the cavernous internal carotid arteries bilaterally without significant stenosis. 5. Otherwise normal CTA of the head and neck. No large vessel occlusion or hemodynamically significant stenosis. 6. 2.1 cm left thyroid nodule. Recommend non-emergent thyroid  ultrasound. Reference: J Am Coll Radiol. 2015 Feb;12(2): 143-50 Electronically Signed   By: Marin Roberts M.D.   On: 11/13/2023 18:43   DG Chest 2 View Result Date: 11/13/2023 CLINICAL DATA:  Hypertension. EXAM: CHEST - 2 VIEW COMPARISON:  Jan 11, 2019. FINDINGS: The heart size and mediastinal contours are within normal limits. Both lungs are clear. The visualized skeletal structures are unremarkable. IMPRESSION: No active cardiopulmonary disease. Electronically Signed   By: Lupita Raider M.D.   On: 11/13/2023 15:16    Procedures Procedures    Medications Ordered in ED Medications  losartan (COZAAR) tablet 25 mg (25 mg Oral Given 11/13/23 1813)  iohexol (OMNIPAQUE) 350 MG/ML injection 75 mL (75 mLs Intravenous Contrast Given 11/13/23 1820)    ED Course/ Medical Decision Making/ A&P                                 Medical Decision Making Amount and/or Complexity of Data Reviewed Labs: ordered. Radiology:  ordered.  Risk Prescription drug management.   This patient presents to the ED with chief complaint(s) of elevated blood pressure .  The complaint involves an extensive differential diagnosis and also carries with it a high risk of complications and morbidity.   pertinent past medical history as listed in HPI  The differential diagnosis includes  CVA, TIA, vertebral dissection, ACS, cervical stenosis, asymptomatic hypertension The initial plan is to  Will start with basic labs Additional history obtained: Additional history obtained from spouse Records reviewed Care Everywhere/External Records  Initial Assessment:   Patient presenting hypertensive to 228/94 otherwise hemodynamically stable with complaints of atraumatic left neck and upper extremity pain x 5 weeks.  No acute worsening of symptoms.  She has no blurry vision, dizziness or headache.  No extremity weakness.  No neurodeficits on exam.  She has no chest pain or shortness of breath.  No cardiac history.  Symptoms  are not exertional.  States she forgot to take her blood pressure medication today.  It appears that her neurologist plan to obtain a CT scan given gradual decline of memory.  It is unclear whether her current symptoms are asymptomatic hypertension with cervical stenosis/musculoskeletal etiology verses some acute central etiology.  Will obtain CTA head and neck.  Independent ECG interpretation:  Sinus rhythm with right bundle branch block, unchanged from prior EKG  Independent labs interpretation:  The following labs were independently interpreted:  BMP and CBC without significant abnormality, troponin without elevation  Independent visualization and interpretation of imaging: I independently visualized the following imaging with scope of interpretation limited to determining acute life threatening conditions related to emergency care: CTA head and neck, which revealed no acute abnormality  Treatment and Reassessment: Patient's blood pressure began to downtrend to 176/60.  However during reexamination her blood pressure increased back to systolics over 200s.  Will give patient's losartan 25.  Upon reexamination patient's pressures have improved to 160/68.  Patient remains asymptomatic, other than neck pain.  Discussed discharge plan with close PCP follow-up.  She is agreeable.  Consultations obtained:   none  Disposition:   Patient will be discharged home.  Encouraged to resume blood pressure regiment and to contact PCP tomorrow for close follow-up. The patient has been appropriately medically screened and/or stabilized in the ED. I have low suspicion for any other emergent medical condition which would require further screening, evaluation or treatment in the ED or require inpatient management. At time of discharge the patient is hemodynamically stable and in no acute distress. I have discussed work-up results and diagnosis with patient and answered all questions. Patient is agreeable with  discharge plan. We discussed strict return precautions for returning to the emergency department and they verbalized understanding.     Social Determinants of Health:   none 3  This note was dictated with voice recognition software.  Despite best efforts at proofreading, errors may have occurred which can change the documentation meaning.          Final Clinical Impression(s) / ED Diagnoses Final diagnoses:  Elevated blood pressure reading  Neck pain    Rx / DC Orders ED Discharge Orders     None         Fabienne Bruns 11/13/23 2038    Margarita Grizzle, MD 11/15/23 [phone]

## 2023-11-13 NOTE — Telephone Encounter (Signed)
 On-call was paged at 6:30 AM Saturday morning with a note from patient stating "she was given new medication to start taking, but she wants to understand her condition prior to starting it, please call".  I called patient back and she says that she has been prescribed donepezil for mild dementia and she has not yet started taking it and would like to understand more about her dementia.  I advised the patient that she she should call our office next week.  States she does not have an appointment.  I advised her to call the office and make an appointment and it is okay that she has not taken her donepezil yet and she should wait until talking to our office.  POD 2 you may want to preemptively give her a call to discuss on Monday.

## 2023-11-13 NOTE — ED Notes (Signed)
 Patient transported to CT

## 2023-11-13 NOTE — ED Triage Notes (Signed)
 Pt via pov from UC with hypertension. She reports having an eye procedure yesterday and went to the UC because she was having headache and neck pain. She reports that her blood pressure was very high, but she does not remember the reading. Provider told her that the eye drop she was given had a side effect of HTN. Pt alert & oriented, nad noted.

## 2023-11-15 ENCOUNTER — Telehealth: Payer: Self-pay | Admitting: *Deleted

## 2023-11-15 NOTE — Telephone Encounter (Signed)
 1st attempt lvm by hf 11/15/23

## 2023-11-15 NOTE — Telephone Encounter (Signed)
 Pt called, she was  seen  in the emergency room over the weekend. Pt request a call from the nurse.

## 2023-11-15 NOTE — Telephone Encounter (Signed)
 error

## 2023-11-15 NOTE — Telephone Encounter (Signed)
 Thank you Dr. Lucia Gaskins. This is the second time she has called [place] to say that she has not been taking the Donepezil.

## 2023-11-15 NOTE — Telephone Encounter (Signed)
 Please call patient and discuss starting Aricept for her mild dementia.

## 2023-11-16 DIAGNOSIS — I10 Essential (primary) hypertension: Secondary | ICD-10-CM | POA: Diagnosis not present

## 2023-11-16 DIAGNOSIS — G8929 Other chronic pain: Secondary | ICD-10-CM | POA: Diagnosis not present

## 2023-11-16 DIAGNOSIS — R413 Other amnesia: Secondary | ICD-10-CM | POA: Diagnosis not present

## 2023-11-16 DIAGNOSIS — E041 Nontoxic single thyroid nodule: Secondary | ICD-10-CM | POA: Diagnosis not present

## 2023-11-16 DIAGNOSIS — M542 Cervicalgia: Secondary | ICD-10-CM | POA: Diagnosis not present

## 2023-11-16 NOTE — Telephone Encounter (Signed)
 I do not need to see her. Please inform patient this medication can help with her memory. She can follow up with PCP.

## 2023-11-16 NOTE — Telephone Encounter (Signed)
 LVM for pt to call back 11/16/2023

## 2023-11-16 NOTE — Telephone Encounter (Signed)
 I have explained medication to pt, multiple times and pt's husband, and pt' still don't understand. I spent a total of 2 hours of last week, as well as today explaining the same thing over and over with pt.  What can I do to help

## 2023-11-16 NOTE — Telephone Encounter (Signed)
 Pt states that she has not taken her Donepezil medication. Pt states that she went to the emergency room over the weekend. Pt is needing to come in to be seen to discuss what is going on.   Sending to MD to advise.

## 2023-11-18 ENCOUNTER — Telehealth: Payer: Self-pay | Admitting: Neurology

## 2023-11-18 ENCOUNTER — Other Ambulatory Visit: Payer: Self-pay | Admitting: Family Medicine

## 2023-11-18 DIAGNOSIS — E041 Nontoxic single thyroid nodule: Secondary | ICD-10-CM

## 2023-11-18 NOTE — Telephone Encounter (Signed)
 noted

## 2023-11-18 NOTE — Telephone Encounter (Signed)
 Pt has called to inform CMA that she has an eye Dr appointment on 3-14 with a Dr Cathey Endow, she will reach out to [facility] on Monday

## 2023-11-22 ENCOUNTER — Telehealth: Payer: Self-pay | Admitting: Neurology

## 2023-11-22 DIAGNOSIS — H401132 Primary open-angle glaucoma, bilateral, moderate stage: Secondary | ICD-10-CM | POA: Diagnosis not present

## 2023-11-22 NOTE — Telephone Encounter (Signed)
 Pt called stating that she was supposed to be scheduled for an appt this month. I informed her that there was not an appt scheduled and she requested to speak to the nurse. Please advise.

## 2023-11-23 NOTE — Telephone Encounter (Signed)
 Call to patient, she requests appointment to discuss diagnosis and medications recommended. Advised to bring family with her due to multiple lengthy phone calls discussing this.

## 2023-11-25 ENCOUNTER — Ambulatory Visit: Admitting: Podiatry

## 2023-11-26 ENCOUNTER — Ambulatory Visit: Admitting: Podiatry

## 2023-11-29 DIAGNOSIS — M4802 Spinal stenosis, cervical region: Secondary | ICD-10-CM | POA: Diagnosis not present

## 2023-11-29 DIAGNOSIS — M542 Cervicalgia: Secondary | ICD-10-CM | POA: Diagnosis not present

## 2023-11-29 DIAGNOSIS — E041 Nontoxic single thyroid nodule: Secondary | ICD-10-CM | POA: Diagnosis not present

## 2023-11-29 DIAGNOSIS — M5412 Radiculopathy, cervical region: Secondary | ICD-10-CM | POA: Diagnosis not present

## 2023-11-30 ENCOUNTER — Encounter: Payer: Self-pay | Admitting: Neurology

## 2023-11-30 ENCOUNTER — Ambulatory Visit (INDEPENDENT_AMBULATORY_CARE_PROVIDER_SITE_OTHER): Admitting: Neurology

## 2023-11-30 VITALS — BP 191/69 | HR 76 | Ht 62.0 in | Wt 119.0 lb

## 2023-11-30 DIAGNOSIS — G301 Alzheimer's disease with late onset: Secondary | ICD-10-CM | POA: Diagnosis not present

## 2023-11-30 DIAGNOSIS — F02A0 Dementia in other diseases classified elsewhere, mild, without behavioral disturbance, psychotic disturbance, mood disturbance, and anxiety: Secondary | ICD-10-CM

## 2023-11-30 NOTE — Progress Notes (Signed)
 [facility]  PATIENT: Barbara Rodriguez DOB: 11/21/1940  REQUESTING CLINICIAN: Mila Palmer, MD HISTORY FROM: Patient  REASON FOR VISIT: Memory loss    HISTORICAL  CHIEF COMPLAINT:  Chief Complaint  Patient presents with   Follow-up    Rm13, alone, Pt is here to discuss treatment and medications    INTERVAL HISTORY 11/30/2023:  Patient presents today for follow-up, last visit was in January, at that time we obtained ATN profile which come back positive for Alzheimer disease biomarker.  I contacted the patient, informed her that her memory loss is likely from Alzheimer disease and recommended Aricept.  She has not tried the medication, tells me that she is worried about the side effect and is will not take it.  Since last visit, she has called the office numerous time with the same questions, again on exam today she is noted to be repetitive.    HISTORY OF PRESENT ILLNESS:  This 83 year old woman past medical history of hypertension, hyperlipidemia, diabetes who was referred for PCP for memory problem.  Patient tells me that sometimes she is forgetful but thinks this is normal for a 83 year old woman.  She is independent, lives with her husband, she is doing the cooking, cleaning, has no issue taking care of herself.  She reports driving, denies any recent accident or being lost in familiar places.  Denies forgetting names of family members, reports that her sleep is good.  She is alone today therefore unable to get any type of collateral history. During our visit she was perseverating and repeating the fact that the nurse working with her PCP was not happy with her going to urgent care and she has left shoulder pain.  She repeated that multiple times.  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   TBI:  No past history of TBI Stroke:  no past history of stroke Seizures:   no past history of seizures Sleep: no history of sleep apnea.  Mood: patient denies anxiety and depression Family history of Dementia: Denies  Functional status: independent in all ADLs and IADLs Patient lives with husband. Cooking: yes Cleaning: yes Shopping: yes Bathing: no issues  Toileting: no issues  Driving: yes, denies recent accident  Bills: No issues  Medications: no issues  Ever left the stove on by accident?: Denies  Forget how to use items around the house?: Denies  Getting lost going to familiar places?: Denies  Forgetting loved ones names?: Denies  Word finding difficulty? Denies  Sleep: Good    OTHER MEDICAL CONDITIONS: Hypertension, Hyperlipidemia, Diabetes, Seasonal allergy, left shoulder pain    REVIEW OF SYSTEMS: Full 14 system review of systems performed and negative with exception of: As noted in the HPI   ALLERGIES: Allergies  Allergen Reactions   Align [Bacid] Other (See Comments)    GI upset--"stomach problems" & weakness   Clindamycin/Lincomycin Diarrhea    GI upset--"stomach problems"   Erythromycin Other (See Comments)    indigestion   Prednisone Other (See Comments)    jittery   Ramipril Other (See Comments)    GI upset--"stomach problems"   Red Yeast Rice Extract Other (See Comments)    Muscle aches   Red Yeast Rice [Monascus  Purpureus Went Yeast] Other (See Comments)    GI upset--"stomach problems"   Tizanidine Other (See Comments)    Drowsy, dizzy, funny feeling   Vitamin B12 Diarrhea   Amoxicillin Rash    Has patient had a PCN reaction causing immediate rash, facial/tongue/throat swelling, SOB or lightheadedness with hypotension: Yes Has patient had a PCN reaction causing severe rash involving mucus membranes or skin necrosis: No Has patient had a PCN  reaction that required hospitalization: No Has patient had a PCN reaction occurring within the last 10 years: No If all of the above answers are "NO", then may proceed with Cephalosporin use.     HOME MEDICATIONS: Outpatient Medications Prior to Visit  Medication Sig Dispense Refill   ACCU-CHEK GUIDE TEST test strip USE TO TEST BLOOD SUGAR 2 TO 3 TIMES A DAY AS DIRECTED     Blood Glucose Monitoring Suppl (ACCU-CHEK GUIDE) w/Device KIT USE TO CHECK BLOOD GLUCOSE 2-3 TIMES DAILY     ciclopirox (PENLAC) 8 % solution Apply topically at bedtime. Apply over nail and surrounding skin. Apply daily over previous coat. After seven (7) days, may remove with alcohol and continue cycle. 6.6 mL 2   ECHINACEA EXTRACT PO Take by mouth daily as needed.     fexofenadine (ALLEGRA) 180 MG tablet Take 180 mg by mouth daily as needed for allergies or rhinitis.     latanoprost (XALATAN) 0.005 % ophthalmic solution 1 drop at bedtime.     losartan (COZAAR) 25 MG tablet TAKE 1 TABLET(25 MG) BY MOUTH IN THE MORNING AND AT BEDTIME 30 tablet 0   metFORMIN (GLUCOPHAGE) 500 MG tablet Take 1 tablet in the morning and 1/2 tablet in the evening     donepezil (ARICEPT) 10 MG tablet Take 1 tablet (10 mg total) by mouth at bedtime. 30 tablet 1   No facility-administered medications prior to visit.    PAST MEDICAL HISTORY: Past Medical History:  Diagnosis Date   Diabetes mellitus without complication (HCC)    Hypertension    Pinched nerve in neck    Seasonal allergies     PAST SURGICAL HISTORY: Past Surgical History:  Procedure Laterality Date   COLONOSCOPY     DILATATION & CURETTAGE/HYSTEROSCOPY WITH MYOSURE N/A 05/20/2017   Procedure: DILATATION & CURETTAGE/HYSTEROSCOPY WITH MYOSURE;  Surgeon: Myna Hidalgo, DO;  Location: [facility];  Service: Gynecology;  Laterality: N/A;  Polypectomy   DILATION AND CURETTAGE OF UTERUS      FAMILY HISTORY: Family History  Problem Relation Age of Onset   Thyroid disease Mother     Diabetes Maternal Aunt     SOCIAL HISTORY: Social History   Socioeconomic History   Marital status: Married    Spouse name: Not on file   Number of children: Not on file   Years of education: Not on file   Highest education level: Not on file  Occupational History   Not on file  Tobacco Use   Smoking status: Former    Current packs/day: 0.00    Types: Cigarettes    Quit date: 08/19/1983    Years since quitting: 40.3   Smokeless tobacco: Never  Vaping Use   Vaping status: Never Used  Substance and Sexual Activity   Alcohol use: No   Drug use: No   Sexual activity: Not on file  Other Topics Concern   Not on file  Social History Narrative   Not on file   Social Drivers of Health   Financial Resource Strain: Not on file  Food Insecurity: No Food Insecurity (06/18/2022)   Hunger Vital Sign    Worried About Running Out of Food in the Last Year: Never true    Ran Out of Food in the Last Year: Never true  Transportation Needs: No Transportation Needs (06/18/2022)   PRAPARE - Administrator, Civil Service (Medical): No    Lack of Transportation (Non-Medical): No  Physical Activity: Not on file  Stress: Not on file  Social Connections: Unknown (06/18/2022)   Social Connection and Isolation Panel [NHANES]    Frequency of Communication with Friends and Family: Once a week    Frequency of Social Gatherings with Friends and Family: Once a week    Attends Religious Services: 1 to 4 times per year    Active Member of [employer] or Organizations: Not on file    Attends Banker Meetings: Not on file    Marital Status: Not on file  Intimate Partner Violence: Not on file    PHYSICAL EXAM  GENERAL EXAM/CONSTITUTIONAL: Vitals:  Vitals:   11/30/23 1510 11/30/23 1515  BP: (!) 192/74 (!) 191/69  Pulse: 76   Weight: 119 lb (54 kg)   Height: 5\' 2"  (1.575 m)    Body mass index is 21.77 kg/m. Wt Readings from Last 3 Encounters:  11/30/23 119 lb (54 kg)   11/13/23 126 lb 1.7 oz (57.2 kg)  10/05/23 126 lb (57.2 kg)   Patient is in no distress; well developed, nourished and groomed; neck is supple  MUSCULOSKELETAL: Gait, strength, tone, movements noted in Neurologic exam below  NEUROLOGIC: MENTAL STATUS:      No data to display            10/05/2023    1:09 PM  Montreal Cognitive Assessment   Visuospatial/ Executive (0/5) 3  Naming (0/3) 3  Attention: Read list of digits (0/2) 2  Attention: Read list of letters (0/1) 1  Attention: Serial 7 subtraction starting at 100 (0/3) 1  Language: Repeat phrase (0/2) 2  Language : Fluency (0/1) 0  Abstraction (0/2) 2  Delayed Recall (0/5) 0  Orientation (0/6) 5  Total 19  Adjusted Score (based on education) 19     CRANIAL NERVE:  2nd, 3rd, 4th, 6th- visual fields full to confrontation, extraocular muscles intact, no nystagmus 5th - facial sensation symmetric 7th - facial strength symmetric 8th - hearing intact 9th - palate elevates symmetrically, uvula midline 11th - shoulder shrug symmetric 12th - tongue protrusion midline  MOTOR:  normal bulk and tone, full strength in the BUE, BLE  SENSORY:  normal and symmetric to light touch  COORDINATION:  finger-nose-finger, fine finger movements normal  GAIT/STATION:  normal   DIAGNOSTIC DATA (LABS, IMAGING, TESTING) - I reviewed patient records, labs, notes, testing and imaging myself where available.  Lab Results  Component Value Date   WBC 5.3 11/13/2023   HGB 12.3 11/13/2023   HCT 38.1 11/13/2023   MCV 80.0 11/13/2023   PLT 283 11/13/2023      Component Value Date/Time   NA 140 11/13/2023 1509   K 3.7 11/13/2023 1509   CL 104 11/13/2023 1509   CO2 28 11/13/2023 1509   GLUCOSE 103 (H) 11/13/2023 1509   BUN 7 (L) 11/13/2023 1509   CREATININE 0.63 11/13/2023 1509   CALCIUM 9.2 11/13/2023 1509   PROT 6.5 01/11/2019 1608   ALBUMIN 3.7 01/11/2019 1608   AST 20 01/11/2019 1608   ALT 13 01/11/2019 1608   ALKPHOS  77  01/11/2019 1608   BILITOT 0.7 01/11/2019 1608   GFRNONAA >60 11/13/2023 1509   GFRAA >60 01/11/2019 1608   No results found for: "CHOL", "HDL", "LDLCALC", "LDLDIRECT", "TRIG", "CHOLHDL" No results found for: "HGBA1C" Lab Results  Component Value Date   VITAMINB12 558 10/05/2023   Lab Results  Component Value Date   TSH 1.580 10/05/2023   ATN Profile positive for Alzheimer disease pathology    ASSESSMENT AND PLAN  83 y.o. year old female with history of hypertension, diabetes mellitus, mild Alzheimer disease who presenting for follow-up.  Since last visit in January, patient has called office multiple times with the same questions.  I did inform her that her ATN profile was positive for Alzheimer disease and her symptoms are likely due to Alzheimer dementia, mild.  We discussed medication including Aricept, patient tells me that she would like to defer the medication due to potential side effects.  At this time I have advised her to continue her current medications, continue to follow-up with her doctors and return as needed.  She voiced understanding.   1. Mild late onset Alzheimer's dementia, unspecified whether behavioral, psychotic, or mood disturbance or anxiety (HCC)      Patient Instructions  Continue current medications  Continue to follow up with PCP  Return as needed   There are well-accepted and sensible ways to reduce risk for Alzheimers disease and other degenerative brain disorders .  Exercise Daily Walk A daily 20 minute walk should be part of your routine. Disease related apathy can be a significant roadblock to exercise and the only way to overcome this is to make it a daily routine and perhaps have a reward at the end (something your loved one loves to eat or drink perhaps) or a personal trainer coming to the home can also be very useful. Most importantly, the patient is much more likely to exercise if the caregiver / spouse does it with him/her. In general a  structured, repetitive schedule is best.  General Health: Any diseases which effect your body will effect your brain such as a pneumonia, urinary infection, blood clot, heart attack or stroke. Keep contact with your primary care doctor for regular follow ups.  Sleep. A good nights sleep is healthy for the brain. Seven hours is recommended. If you have insomnia or poor sleep habits we can give you some instructions. If you have sleep apnea wear your mask.  Diet: Eating a heart healthy diet is also a good idea; fish and poultry instead of red meat, nuts (mostly non-peanuts), vegetables, fruits, olive oil or canola oil (instead of butter), minimal salt (use other spices to flavor foods), whole grain rice, bread, cereal and pasta and wine in moderation.Research is now showing that the MIND diet, which is a combination of The Mediterranean diet and the DASH diet, is beneficial for cognitive processing and longevity. Information about this diet can be found in The MIND Diet, a book by Alonna Minium, MS, RDN, and online at [employer].es  Finances, Power of [address] and Advance Directives: You should consider putting legal safeguards in place with regard to financial and medical decision making. While the spouse always has power of attorney for medical and financial issues in the absence of any form, you should consider what you want in case the spouse / caregiver is no longer around or capable of making decisions.   No orders of the defined types were placed in this encounter.   No orders of the defined types were  placed in this encounter.   Return if symptoms worsen or fail to improve.    Windell Norfolk, MD 11/30/2023, 4:22 PM  [facility] [address], [address] [place], [place] [address] [phone]

## 2023-11-30 NOTE — Patient Instructions (Addendum)
 Continue current medications  Continue to follow up with PCP  Return as needed   There are well-accepted and sensible ways to reduce risk for Alzheimers disease and other degenerative brain disorders .  Exercise Daily Walk A daily 20 minute walk should be part of your routine. Disease related apathy can be a significant roadblock to exercise and the only way to overcome this is to make it a daily routine and perhaps have a reward at the end (something your loved one loves to eat or drink perhaps) or a personal trainer coming to the home can also be very useful. Most importantly, the patient is much more likely to exercise if the caregiver / spouse does it with him/her. In general a structured, repetitive schedule is best.  General Health: Any diseases which effect your body will effect your brain such as a pneumonia, urinary infection, blood clot, heart attack or stroke. Keep contact with your primary care doctor for regular follow ups.  Sleep. A good nights sleep is healthy for the brain. Seven hours is recommended. If you have insomnia or poor sleep habits we can give you some instructions. If you have sleep apnea wear your mask.  Diet: Eating a heart healthy diet is also a good idea; fish and poultry instead of red meat, nuts (mostly non-peanuts), vegetables, fruits, olive oil or canola oil (instead of butter), minimal salt (use other spices to flavor foods), whole grain rice, bread, cereal and pasta and wine in moderation.Research is now showing that the MIND diet, which is a combination of The Mediterranean diet and the DASH diet, is beneficial for cognitive processing and longevity. Information about this diet can be found in The MIND Diet, a book by Alonna Minium, MS, RDN, and online at [employer]  Finances, Power of [address] and Advance Directives: You should consider putting legal safeguards in place with regard to financial and medical decision making. While  the spouse always has power of attorney for medical and financial issues in the absence of any form, you should consider what you want in case the spouse / caregiver is no longer around or capable of making decisions.

## 2023-12-06 DIAGNOSIS — M5412 Radiculopathy, cervical region: Secondary | ICD-10-CM | POA: Diagnosis not present

## 2023-12-06 DIAGNOSIS — H401112 Primary open-angle glaucoma, right eye, moderate stage: Secondary | ICD-10-CM | POA: Diagnosis not present

## 2023-12-09 DIAGNOSIS — M791 Myalgia, unspecified site: Secondary | ICD-10-CM | POA: Diagnosis not present

## 2023-12-09 DIAGNOSIS — M7918 Myalgia, other site: Secondary | ICD-10-CM | POA: Diagnosis not present

## 2023-12-13 ENCOUNTER — Ambulatory Visit (INDEPENDENT_AMBULATORY_CARE_PROVIDER_SITE_OTHER): Admitting: Podiatry

## 2023-12-13 DIAGNOSIS — H401432 Capsular glaucoma with pseudoexfoliation of lens, bilateral, moderate stage: Secondary | ICD-10-CM | POA: Diagnosis not present

## 2023-12-13 DIAGNOSIS — Z91199 Patient's noncompliance with other medical treatment and regimen due to unspecified reason: Secondary | ICD-10-CM

## 2023-12-13 NOTE — Progress Notes (Signed)
 No show

## 2023-12-17 DIAGNOSIS — I10 Essential (primary) hypertension: Secondary | ICD-10-CM | POA: Diagnosis not present

## 2023-12-17 DIAGNOSIS — E1165 Type 2 diabetes mellitus with hyperglycemia: Secondary | ICD-10-CM | POA: Diagnosis not present

## 2023-12-17 DIAGNOSIS — Z1321 Encounter for screening for nutritional disorder: Secondary | ICD-10-CM | POA: Diagnosis not present

## 2023-12-17 DIAGNOSIS — E785 Hyperlipidemia, unspecified: Secondary | ICD-10-CM | POA: Diagnosis not present

## 2023-12-17 DIAGNOSIS — Z79899 Other long term (current) drug therapy: Secondary | ICD-10-CM | POA: Diagnosis not present

## 2023-12-17 DIAGNOSIS — F17201 Nicotine dependence, unspecified, in remission: Secondary | ICD-10-CM | POA: Diagnosis not present

## 2023-12-17 DIAGNOSIS — E041 Nontoxic single thyroid nodule: Secondary | ICD-10-CM | POA: Diagnosis not present

## 2023-12-17 DIAGNOSIS — Z1159 Encounter for screening for other viral diseases: Secondary | ICD-10-CM | POA: Diagnosis not present

## 2023-12-17 DIAGNOSIS — H2511 Age-related nuclear cataract, right eye: Secondary | ICD-10-CM | POA: Diagnosis not present

## 2023-12-27 DIAGNOSIS — L309 Dermatitis, unspecified: Secondary | ICD-10-CM | POA: Diagnosis not present

## 2023-12-27 DIAGNOSIS — Z6822 Body mass index (BMI) 22.0-22.9, adult: Secondary | ICD-10-CM | POA: Diagnosis not present

## 2023-12-27 DIAGNOSIS — M79602 Pain in left arm: Secondary | ICD-10-CM | POA: Diagnosis not present

## 2023-12-27 DIAGNOSIS — E785 Hyperlipidemia, unspecified: Secondary | ICD-10-CM | POA: Diagnosis not present

## 2023-12-27 DIAGNOSIS — F0280 Dementia in other diseases classified elsewhere without behavioral disturbance: Secondary | ICD-10-CM | POA: Diagnosis not present

## 2023-12-27 DIAGNOSIS — M542 Cervicalgia: Secondary | ICD-10-CM | POA: Diagnosis not present

## 2023-12-27 DIAGNOSIS — M549 Dorsalgia, unspecified: Secondary | ICD-10-CM | POA: Diagnosis not present

## 2023-12-27 DIAGNOSIS — I10 Essential (primary) hypertension: Secondary | ICD-10-CM | POA: Diagnosis not present

## 2023-12-27 DIAGNOSIS — E1165 Type 2 diabetes mellitus with hyperglycemia: Secondary | ICD-10-CM | POA: Diagnosis not present

## 2023-12-31 ENCOUNTER — Other Ambulatory Visit: Payer: Self-pay | Admitting: Family

## 2023-12-31 DIAGNOSIS — L989 Disorder of the skin and subcutaneous tissue, unspecified: Secondary | ICD-10-CM | POA: Diagnosis not present

## 2023-12-31 DIAGNOSIS — M1990 Unspecified osteoarthritis, unspecified site: Secondary | ICD-10-CM | POA: Diagnosis not present

## 2023-12-31 DIAGNOSIS — J309 Allergic rhinitis, unspecified: Secondary | ICD-10-CM | POA: Diagnosis not present

## 2023-12-31 DIAGNOSIS — E2839 Other primary ovarian failure: Secondary | ICD-10-CM

## 2023-12-31 DIAGNOSIS — F0280 Dementia in other diseases classified elsewhere without behavioral disturbance: Secondary | ICD-10-CM | POA: Diagnosis not present

## 2023-12-31 DIAGNOSIS — G309 Alzheimer's disease, unspecified: Secondary | ICD-10-CM | POA: Diagnosis not present

## 2023-12-31 DIAGNOSIS — Z7984 Long term (current) use of oral hypoglycemic drugs: Secondary | ICD-10-CM | POA: Diagnosis not present

## 2023-12-31 DIAGNOSIS — Z0001 Encounter for general adult medical examination with abnormal findings: Secondary | ICD-10-CM | POA: Diagnosis not present

## 2024-01-03 DIAGNOSIS — M4802 Spinal stenosis, cervical region: Secondary | ICD-10-CM | POA: Diagnosis not present

## 2024-01-03 DIAGNOSIS — M5412 Radiculopathy, cervical region: Secondary | ICD-10-CM | POA: Diagnosis not present

## 2024-01-17 ENCOUNTER — Other Ambulatory Visit: Payer: Self-pay | Admitting: Family

## 2024-01-17 DIAGNOSIS — Z1231 Encounter for screening mammogram for malignant neoplasm of breast: Secondary | ICD-10-CM

## 2024-01-20 ENCOUNTER — Ambulatory Visit
Admission: RE | Admit: 2024-01-20 | Discharge: 2024-01-20 | Disposition: A | Discharge status: Home / Self Care | Source: Ambulatory Visit | Attending: Family | Admitting: Family

## 2024-01-20 DIAGNOSIS — Z1231 Encounter for screening mammogram for malignant neoplasm of breast: Secondary | ICD-10-CM

## 2024-01-24 ENCOUNTER — Ambulatory Visit: Admitting: Podiatry

## 2024-02-14 ENCOUNTER — Encounter: Payer: Self-pay | Admitting: Podiatry

## 2024-02-14 ENCOUNTER — Ambulatory Visit (INDEPENDENT_AMBULATORY_CARE_PROVIDER_SITE_OTHER): Admitting: Podiatry

## 2024-02-14 VITALS — Ht 62.0 in | Wt 119.0 lb

## 2024-02-14 DIAGNOSIS — M79675 Pain in left toe(s): Secondary | ICD-10-CM

## 2024-02-14 DIAGNOSIS — B351 Tinea unguium: Secondary | ICD-10-CM | POA: Diagnosis not present

## 2024-02-14 DIAGNOSIS — M79674 Pain in right toe(s): Secondary | ICD-10-CM | POA: Diagnosis not present

## 2024-02-14 NOTE — Progress Notes (Signed)
   Chief Complaint  Patient presents with   Nail Problem    Pt is here to discuss right great toenail she thinks she has a toenail fungus states the toenail has been like this for a while has tried many remedies for the issue and nothing has helped.    SUBJECTIVE Patient PMHx diabetes mellitus without complication presents to office today complaining of elongated, thickened nails that cause pain while ambulating in shoes.  Patient is unable to trim their own nails. Patient is here for further evaluation and treatment.  Past Medical History:  Diagnosis Date   Diabetes mellitus without complication (HCC)    Hypertension    Pinched nerve in neck    Seasonal allergies     Allergies  Allergen Reactions   Align [Bacid] Other (See Comments)    GI upset--"stomach problems" & weakness   Clindamycin/Lincomycin Diarrhea    GI upset--"stomach problems"   Erythromycin Other (See Comments)    indigestion   Prednisone Other (See Comments)    jittery   Ramipril Other (See Comments)    GI upset--"stomach problems"   Red Yeast Rice Extract Other (See Comments)    Muscle aches   Red Yeast Rice [Monascus Purpureus Went Yeast] Other (See Comments)    GI upset--"stomach problems"   Tizanidine Other (See Comments)    Drowsy, dizzy, funny feeling   Vitamin B12 Diarrhea   Amoxicillin Rash    Has patient had a PCN reaction causing immediate rash, facial/tongue/throat swelling, SOB or lightheadedness with hypotension: Yes Has patient had a PCN reaction causing severe rash involving mucus membranes or skin necrosis: No Has patient had a PCN reaction that required hospitalization: No Has patient had a PCN reaction occurring within the last 10 years: No If all of the above answers are "NO", then may proceed with Cephalosporin use.      OBJECTIVE General Patient is awake, alert, and oriented x 3 and in no acute distress. Derm Skin is dry and supple bilateral. Negative open lesions or macerations.  Remaining integument unremarkable. Nails are tender, long, thickened and dystrophic with subungual debris, consistent with onychomycosis, 1-5 bilateral. No signs of infection noted. Vasc  DP and PT pedal pulses palpable bilaterally. Temperature gradient within normal limits.  Neuro Epicritic and protective threshold sensation grossly intact bilaterally.  Musculoskeletal Exam No symptomatic pedal deformities noted bilateral. Muscular strength within normal limits.  ASSESSMENT 1.  Pain due to onychomycosis of toenails both  PLAN OF CARE 1. Patient evaluated today.  2. Instructed to maintain good pedal hygiene and foot care.  3. Mechanical debridement of nails 1-5 bilaterally performed using a nail nipper. Filed with dremel without incident.  4. Return to clinic in 3 mos.    Dot Gazella, DPM [facility]  Dr. Dot Gazella, DPM    [address] [place], [place] [address]                Office [phone]  Fax [phone]

## 2024-02-22 ENCOUNTER — Other Ambulatory Visit: Payer: Self-pay | Admitting: Family Medicine

## 2024-02-22 ENCOUNTER — Ambulatory Visit
Admission: RE | Admit: 2024-02-22 | Discharge: 2024-02-22 | Disposition: A | Discharge status: Home / Self Care | Source: Ambulatory Visit | Attending: Family Medicine | Admitting: Family Medicine

## 2024-02-22 DIAGNOSIS — M549 Dorsalgia, unspecified: Secondary | ICD-10-CM

## 2024-03-03 ENCOUNTER — Emergency Department (HOSPITAL_BASED_OUTPATIENT_CLINIC_OR_DEPARTMENT_OTHER)
Admission: EM | Admit: 2024-03-03 | Discharge: 2024-03-03 | Disposition: A | Discharge status: Home / Self Care | Attending: Emergency Medicine | Admitting: Emergency Medicine

## 2024-03-03 ENCOUNTER — Encounter (HOSPITAL_BASED_OUTPATIENT_CLINIC_OR_DEPARTMENT_OTHER): Payer: Self-pay

## 2024-03-03 ENCOUNTER — Other Ambulatory Visit: Payer: Self-pay

## 2024-03-03 DIAGNOSIS — Z79899 Other long term (current) drug therapy: Secondary | ICD-10-CM | POA: Diagnosis not present

## 2024-03-03 DIAGNOSIS — E119 Type 2 diabetes mellitus without complications: Secondary | ICD-10-CM | POA: Insufficient documentation

## 2024-03-03 DIAGNOSIS — I10 Essential (primary) hypertension: Secondary | ICD-10-CM | POA: Insufficient documentation

## 2024-03-03 DIAGNOSIS — M542 Cervicalgia: Secondary | ICD-10-CM | POA: Diagnosis present

## 2024-03-03 DIAGNOSIS — Z7984 Long term (current) use of oral hypoglycemic drugs: Secondary | ICD-10-CM | POA: Insufficient documentation

## 2024-03-03 DIAGNOSIS — M5412 Radiculopathy, cervical region: Secondary | ICD-10-CM | POA: Diagnosis not present

## 2024-03-03 LAB — COMPREHENSIVE METABOLIC PANEL WITH GFR
ALT: 26 U/L (ref 0–44)
AST: 25 U/L (ref 15–41)
Albumin: 4.5 g/dL (ref 3.5–5.0)
Alkaline Phosphatase: 100 U/L (ref 38–126)
Anion gap: 11 (ref 5–15)
BUN: 12 mg/dL (ref 8–23)
CO2: 26 mmol/L (ref 22–32)
Calcium: 9.8 mg/dL (ref 8.9–10.3)
Chloride: 105 mmol/L (ref 98–111)
Creatinine, Ser: 0.73 mg/dL (ref 0.44–1.00)
GFR, Estimated: >60 mL/min (ref >60)
Glucose, Bld: 111 mg/dL — ABNORMAL HIGH (ref 70–99)
Potassium: 4 mmol/L (ref 3.5–5.1)
Sodium: 141 mmol/L (ref 135–145)
Total Bilirubin: 0.6 mg/dL (ref 0.0–1.2)
Total Protein: 7.5 g/dL (ref 6.5–8.1)

## 2024-03-03 LAB — CBC WITH DIFFERENTIAL/PLATELET
Abs Immature Granulocytes: 0.01 10*3/uL (ref 0.00–0.07)
Basophils Absolute: 0 10*3/uL (ref 0.0–0.1)
Basophils Relative: 0 %
Eosinophils Absolute: 0.1 10*3/uL (ref 0.0–0.5)
Eosinophils Relative: 2 %
HCT: 38.6 % (ref 36.0–46.0)
Hemoglobin: 12.4 g/dL (ref 12.0–15.0)
Immature Granulocytes: 0 %
Lymphocytes Relative: 43 %
Lymphs Abs: 2 10*3/uL (ref 0.7–4.0)
MCH: 25.9 pg — ABNORMAL LOW (ref 26.0–34.0)
MCHC: 32.1 g/dL (ref 30.0–36.0)
MCV: 80.6 fL (ref 80.0–100.0)
Monocytes Absolute: 0.4 10*3/uL (ref 0.1–1.0)
Monocytes Relative: 9 %
Neutro Abs: 2.1 10*3/uL (ref 1.7–7.7)
Neutrophils Relative %: 46 %
Platelets: 277 10*3/uL (ref 150–400)
RBC: 4.79 MIL/uL (ref 3.87–5.11)
RDW: 14.6 % (ref 11.5–15.5)
WBC: 4.6 10*3/uL (ref 4.0–10.5)
nRBC: 0 % (ref 0.0–0.2)

## 2024-03-03 LAB — TROPONIN T, HIGH SENSITIVITY: Troponin T High Sensitivity: <15 ng/L (ref <19)

## 2024-03-03 MED ORDER — DICLOFENAC SODIUM 50 MG PO TBEC: 50.0000 mg | DELAYED_RELEASE_TABLET | Freq: Two times a day (BID) | ORAL | 0 refills | Status: AC

## 2024-03-03 MED ORDER — FENTANYL CITRATE PF 50 MCG/ML IJ SOSY
12.5000 ug | PREFILLED_SYRINGE | Freq: Once | INTRAMUSCULAR | Status: AC
  Administered 2024-03-03: 12.5 ug via INTRAVENOUS
  Filled 2024-03-03: qty 1

## 2024-03-03 NOTE — ED Provider Notes (Signed)
 [facility] EMERGENCY DEPARTMENT AT [facility] Provider Note   CSN: [id number] Arrival date & time: 03/03/24  1359     Patient presents with: Hypertension   Barbara Rodriguez is a 83 y.o. female. Patient with past history significant for hypertension, cervical radiculopathy, DM presents to the emergency department with concerns of neck pain and hypertension.  She reports that she has been followed by orthopedics with regards to her chronic ongoing neck pain.  States that she currently takes some medications for this but is unsure the name of these medications.  Denies any recent falls or injuries.  Reports at times she feels pain in her neck that is more pronounced with radiation into the left arm.  No reported chest pain or shortness of breath.  Denies any vision changes or leg swelling.   Hypertension       Prior to Admission medications   Medication Sig Start Date End Date Taking? Authorizing Provider  amLODipine (NORVASC) 5 MG tablet Take 5 mg by mouth daily. 02/21/24  Yes [provider]  cholecalciferol (VITAMIN D3) 25 MCG (1000 UNIT) tablet Take 1,000 Units by mouth daily.   Yes [provider]  losartan  (COZAAR ) 25 MG tablet TAKE 1 TABLET(25 MG) BY MOUTH IN THE MORNING AND AT BEDTIME 10/25/23  Yes Walker, Caitlin S, NP  metFORMIN (GLUCOPHAGE) 500 MG tablet Take 1 tablet in the morning and 1/2 tablet in the evening   Yes [provider]  ACCU-CHEK GUIDE TEST test strip USE TO TEST BLOOD SUGAR 2 TO 3 TIMES A DAY AS DIRECTED 07/28/23   [provider]  Blood Glucose Monitoring Suppl (ACCU-CHEK GUIDE) w/Device KIT USE TO CHECK BLOOD GLUCOSE 2-3 TIMES DAILY 07/29/23   [provider]  ciclopirox  (PENLAC ) 8 % solution Apply topically at bedtime. Apply over nail and surrounding skin. Apply daily over previous coat. After seven (7) days, may remove with alcohol and continue cycle. 05/24/23   Janit Thresa HERO, DPM  ECHINACEA EXTRACT PO Take  by mouth daily as needed.    [provider]  fexofenadine (ALLEGRA) 180 MG tablet Take 180 mg by mouth daily as needed for allergies or rhinitis.    [provider]  latanoprost (XALATAN) 0.005 % ophthalmic solution 1 drop at bedtime. 07/31/23   [provider]    Allergies: Align [bacid], Clindamycin/lincomycin, Erythromycin, Prednisone, Ramipril, Red yeast rice extract, Red yeast rice [monascus purpureus went yeast], Tizanidine, Vitamin b12, and Amoxicillin    Review of Systems  Musculoskeletal:  Positive for neck pain.  All other systems reviewed and are negative.   Updated Vital Signs BP (!) 174/63 (BP Location: Right Arm)   Pulse 67   Temp 98.7 F (37.1 C)   Resp 16   Ht 5' 2 (1.575 m)   Wt 54 kg   SpO2 100%   BMI 21.77 kg/m   Physical Exam Vitals and nursing note reviewed.  Constitutional:      General: She is not in acute distress.    Appearance: She is well-developed.  HENT:     Head: Normocephalic and atraumatic.   Eyes:     Conjunctiva/sclera: Conjunctivae normal.   Neck:     Comments: Range of motion unremarkable in the cervical spine but there is some point tenderness towards the left trapezius. Cardiovascular:     Rate and Rhythm: Normal rate and regular rhythm.     Heart sounds: No murmur heard. Pulmonary:     Effort: Pulmonary effort is normal.  No respiratory distress.     Breath sounds: Normal breath sounds.  Abdominal:     Palpations: Abdomen is soft.     Tenderness: There is no abdominal tenderness.   Musculoskeletal:        General: No swelling.     Cervical back: Normal range of motion and neck supple. No rigidity.   Skin:    General: Skin is warm and dry.     Capillary Refill: Capillary refill takes less than 2 seconds.   Neurological:     General: No focal deficit present.     Mental Status: She is alert and oriented to person, place, and time. Mental status is at baseline.     Comments: No facial droop,  slurred speech, unilateral weakness or numbness.  5 out of 5 strength in the upper and lower extremities bilaterally.  Psychiatric:        Mood and Affect: Mood normal.     (all labs ordered are listed, but only abnormal results are displayed) Labs Reviewed - No data to display  EKG: None  Radiology: No results found.   Procedures   Medications Ordered in the ED - No data to display                                  Medical Decision Making  This patient presents to the ED for concern of neck pain, hypertension.  Differential diagnosis includes ACS, asymptomatic hypertension, cervical radiculopathy, muscle strain   Lab Tests:  I Ordered, and personally interpreted labs.  The pertinent results include: CBC***, CMP***, troponin***   Imaging Studies ordered:  I ordered imaging studies including ***  I independently visualized and interpreted imaging which showed *** I agree with the radiologist interpretation   Medicines ordered and prescription drug management:  I ordered medication including ***  for ***  Reevaluation of the patient after these medicines showed that the patient {resolved/improved/worsened:23923::improved} I have reviewed the patients home medicines and have made adjustments as needed   Problem List / ED Course:  ***   Social Determinants of Health:    Final diagnoses:  None    ED Discharge Orders     None

## 2024-03-03 NOTE — Discharge Instructions (Signed)
 You are seen in the emergency department today for concerns of shoulder pain and elevated blood pressure.  Your blood pressure is still remaining elevated which is likely due to your pain but I would recommend you take your amlodipine that has been prescribed to you by your primary care provider as recommended.  You should be taking 10 mg daily.  For pain, I sent a prescription for medication called diclofenac which you will take twice daily.  Take this with food to reduce stomach irritation.  You can continue with physical therapy.  For any concerns of new or worsening symptoms, return to the emergency department.

## 2024-03-03 NOTE — ED Triage Notes (Addendum)
 In for eval of elevated blood pressure due to pain in neck and back. Denies headache, blurred vision, dizziness, or epistaxis.

## 2024-03-07 ENCOUNTER — Ambulatory Visit: Payer: Self-pay

## 2024-03-07 NOTE — Telephone Encounter (Signed)
 3rd attempt to contact patient on [phone]. No answer, recording call can not be completed at this time try call again later. Unable to leave  message.     FYI Only or Action Required?: FYI only for provider.  Patient was last seen in primary care on unknown , last seen at [facility]. Called Nurse Triage reporting Medication Problem. Symptoms began today. Interventions attempted: Nothing. Symptoms are: unknown.  Triage Disposition: No Contact Calls  Patient/caregiver understands and will follow disposition?: Unsure    Reason for Disposition  Third attempt to contact caller AND no contact made. Phone number verified.  Answer Assessment - Initial Assessment Questions N/A Called patient x 3 no contact and unable to leave message to call back regarding questions for erythromycin.  Protocols used: No Contact or Duplicate Contact Call-A-AH

## 2024-03-07 NOTE — Telephone Encounter (Signed)
 FYI Only or Action Required?: FYI only for provider.  Patient was last seen in primary care on Unknown. Called Nurse Triage reporting No chief complaint on file.. Symptoms began today. Interventions attempted: Rest, hydration, or home remedies. Symptoms are: gradually worsening.  Triage Disposition: See Physician Within 24 Hours  Patient/caregiver understands and will follow disposition?: Yes   Copied from CRM [id number]. Topic: Clinical - Red Word Triage >> Mar 07, 2024 12:13 PM Barbara Rodriguez wrote: [facility] that prompted transfer to Nurse Triage: pt feeling shaky from medicine, know if thats was causing it or its diabetes. Amlodipine, losartan  takes with diclofenac, started taking on 03/03/2024  moved bowels this morning and feeling weak, sugar had jumped a little. might of ate too much or did not eat enough oatmeal  Reason for Disposition  Taking a medicine that could cause dizziness (e.g., blood pressure medications, diuretics)  Answer Assessment - Initial Assessment Questions 1. DESCRIPTION: Describe your dizziness.     Weakness, Dizziness  2. LIGHTHEADED: Do you feel lightheaded? (e.g., somewhat faint, woozy, weak upon standing)     Weakness  3. VERTIGO: Do you feel like either you or the room is spinning or tilting? (i.e. vertigo)     No  4. SEVERITY: How bad is it?  Do you feel like you are going to faint? Can you stand and walk?   - MILD: Feels slightly dizzy, but walking normally.   - MODERATE: Feels unsteady when walking, but not falling; interferes with normal activities (e.g., school, work).   - SEVERE: Unable to walk without falling, or requires assistance to walk without falling; feels like passing out now.      Mild to Moderate  5. ONSET:  When did the dizziness begin?     Yesterday, Today  6. AGGRAVATING FACTORS: Does anything make it worse? (e.g., standing, change in head position)     Medications, Diet  7. HEART RATE: Can you tell me your heart  rate? How many beats in 15 seconds?  (Note: not all patients can do this)       Unsure  8. CAUSE: What do you think is causing the dizziness?     Unsure if it is her medications or her diabetes  9. RECURRENT SYMPTOM: Have you had dizziness before? If Yes, ask: When was the last time? What happened that time?     No  10. OTHER SYMPTOMS: Do you have any other symptoms? (e.g., fever, chest pain, vomiting, diarrhea, bleeding)       Shakiness, Bowel Irregularity  11. PREGNANCY: Is there any chance you are pregnant? When was your last menstrual period?       No and No  Protocols used: Dizziness - Lightheadedness-A-AH

## 2024-03-07 NOTE — Telephone Encounter (Signed)
 first attemt: call cannot be completed as dialed   Patient calling to ask if she can take erythromycin with the rest of her medication. No PCP on file

## 2024-03-07 NOTE — Telephone Encounter (Signed)
 2nd attempt to contact patient on # [phone] to review medication questions regarding erythromycin. No answer, recording call can not be completed as dialed check # and try call again later. Unable to leave message.

## 2024-05-30 ENCOUNTER — Encounter: Payer: Self-pay | Admitting: Podiatry

## 2024-05-30 ENCOUNTER — Ambulatory Visit (INDEPENDENT_AMBULATORY_CARE_PROVIDER_SITE_OTHER): Admitting: Podiatry

## 2024-05-30 DIAGNOSIS — E119 Type 2 diabetes mellitus without complications: Secondary | ICD-10-CM

## 2024-05-30 DIAGNOSIS — M79674 Pain in right toe(s): Secondary | ICD-10-CM | POA: Diagnosis not present

## 2024-05-30 DIAGNOSIS — M79675 Pain in left toe(s): Secondary | ICD-10-CM

## 2024-05-30 DIAGNOSIS — B351 Tinea unguium: Secondary | ICD-10-CM | POA: Diagnosis not present

## 2024-05-31 NOTE — Progress Notes (Signed)
 Subjective:  Patient ID: Barbara Rodriguez, female    DOB: 08-Jun-1941,  MRN: [id number]  ALSACE DOWD presents to clinic today for: for annual diabetic foot examination and painful, discolored, thick toenails which interfere with daily activities. Chief Complaint  Patient presents with   Diabetes    DFC NIDDM A1C ? CBG 121. Toenail trim. LOV with PCP 12/31/23.    PCP is Verena Mems, MD.  Allergies  Allergen Reactions   Align [Bacid] Other (See Comments)    GI upset--stomach problems & weakness   Clindamycin/Lincomycin Diarrhea    GI upset--stomach problems   Erythromycin Other (See Comments)    indigestion   Prednisone Other (See Comments)    jittery   Ramipril Other (See Comments)    GI upset--stomach problems   Red Yeast Rice Extract Other (See Comments)    Muscle aches   Red Yeast Rice [Monascus Purpureus Went Yeast] Other (See Comments)    GI upset--stomach problems   Tizanidine Other (See Comments)    Drowsy, dizzy, funny feeling   Vitamin B12 Diarrhea   Amoxicillin Rash    Has patient had a PCN reaction causing immediate rash, facial/tongue/throat swelling, SOB or lightheadedness with hypotension: Yes Has patient had a PCN reaction causing severe rash involving mucus membranes or skin necrosis: No Has patient had a PCN reaction that required hospitalization: No Has patient had a PCN reaction occurring within the last 10 years: No If all of the above answers are NO, then may proceed with Cephalosporin use.     Review of Systems: Negative except as noted in the HPI.  Objective: No changes noted in today's physical examination. There were no vitals filed for this visit.  Barbara Rodriguez is a pleasant 83 y.o. female in NAD. AAO x 3.  Vascular Examination: Capillary refill time <3 seconds b/l LE. Palpable pedal pulses b/l LE. Digital hair present b/l. No pedal edema b/l. Skin temperature gradient WNL b/l. No varicosities b/l. No cyanosis or  clubbing. No ischemia or gangrene. .  Dermatological Examination: Pedal skin with normal turgor, texture and tone b/l. No open wounds. No interdigital macerations b/l. Toenails 1-5 b/l thickened, discolored, dystrophic with subungual debris. There is pain on palpation to dorsal aspect of nailplates. No hyperkeratotic nor porokeratotic lesions.   Neurological Examination: Protective sensation intact with 10 gram monofilament b/l LE. Vibratory sensation intact b/l LE.   Musculoskeletal Examination: Muscle strength 5/5 to all lower extremity muscle groups bilaterally. No pain, crepitus or joint limitation noted with ROM bilateral LE. No gross bony deformities bilaterally.  Assessment/Plan: 1. Pain due to onychomycosis of toenails of both feet   2. Controlled type 2 diabetes mellitus without complication, without long-term current use of insulin (HCC)   3. Encounter for diabetic foot exam (HCC)   Diabetic foot examination performed today. All patient's and/or POA's questions/concerns addressed on today's visit. Toenails 1-5 debrided in length and girth without incident. Continue foot and shoe inspections daily. Monitor blood glucose per PCP/Endocrinologist's recommendations. Continue soft, supportive shoe gear daily. Report any pedal injuries to medical professional. Call office if there are any questions/concerns. -Patient/POA to call should there be question/concern in the interim.   Return in about 3 months (around 08/29/2024).  Delon LITTIE Merlin, DPM      [facility] LOCATION: [address]  [place], [place] [address]                   Office [phone]   [facility] LOCATION: [address] [place], [place] [address] Office [phone]

## 2024-08-28 ENCOUNTER — Ambulatory Visit: Admitting: Podiatry

## 2024-09-28 ENCOUNTER — Emergency Department (HOSPITAL_BASED_OUTPATIENT_CLINIC_OR_DEPARTMENT_OTHER)
Admission: EM | Admit: 2024-09-28 | Discharge: 2024-09-28 | Disposition: A | Discharge status: Home / Self Care | Source: Ambulatory Visit | Attending: Emergency Medicine | Admitting: Emergency Medicine

## 2024-09-28 ENCOUNTER — Other Ambulatory Visit: Payer: Self-pay

## 2024-09-28 ENCOUNTER — Encounter (HOSPITAL_BASED_OUTPATIENT_CLINIC_OR_DEPARTMENT_OTHER): Payer: Self-pay | Admitting: Emergency Medicine

## 2024-09-28 DIAGNOSIS — E119 Type 2 diabetes mellitus without complications: Secondary | ICD-10-CM | POA: Diagnosis not present

## 2024-09-28 DIAGNOSIS — T466X1A Poisoning by antihyperlipidemic and antiarteriosclerotic drugs, accidental (unintentional), initial encounter: Secondary | ICD-10-CM | POA: Insufficient documentation

## 2024-09-28 DIAGNOSIS — T461X1A Poisoning by calcium-channel blockers, accidental (unintentional), initial encounter: Secondary | ICD-10-CM | POA: Insufficient documentation

## 2024-09-28 DIAGNOSIS — T50901A Poisoning by unspecified drugs, medicaments and biological substances, accidental (unintentional), initial encounter: Secondary | ICD-10-CM

## 2024-09-28 DIAGNOSIS — X58XXXA Exposure to other specified factors, initial encounter: Secondary | ICD-10-CM | POA: Insufficient documentation

## 2024-09-28 DIAGNOSIS — Z79899 Other long term (current) drug therapy: Secondary | ICD-10-CM | POA: Insufficient documentation

## 2024-09-28 DIAGNOSIS — I10 Essential (primary) hypertension: Secondary | ICD-10-CM | POA: Diagnosis not present

## 2024-09-28 DIAGNOSIS — T383X1A Poisoning by insulin and oral hypoglycemic [antidiabetic] drugs, accidental (unintentional), initial encounter: Secondary | ICD-10-CM | POA: Insufficient documentation

## 2024-09-28 DIAGNOSIS — T465X1A Poisoning by other antihypertensive drugs, accidental (unintentional), initial encounter: Secondary | ICD-10-CM | POA: Diagnosis not present

## 2024-09-28 DIAGNOSIS — Z7984 Long term (current) use of oral hypoglycemic drugs: Secondary | ICD-10-CM | POA: Insufficient documentation

## 2024-09-28 NOTE — ED Triage Notes (Signed)
 Pt Barbara Rodriguez ambulatory NAD reporting she accidentally took amlodipine doses too close together last night and called the pharmacy who recommended she come to the ED for eval. Pt first states she had no physical complaints but then states she had a weird feeling in her chest briefly this morning, denies at present.

## 2024-09-28 NOTE — Discharge Instructions (Signed)
 You were seen in the emergency department after accidentally taking an extra dose of your medications last night.  You had no signs of any serious complication or side effects from taking these medications and you can continue to take your regular medications as prescribed.  You should follow-up with your primary doctor as needed and it may be helpful to get a pillbox so that you know if you have taken your medications already for the day.  You should return to the emergency department if you become lightheaded or pass out, have repetitive vomiting, severe chest pain or any other new or concerning symptoms.

## 2024-09-28 NOTE — ED Provider Notes (Signed)
 " [place] EMERGENCY DEPARTMENT AT [facility] Provider Note   CSN: [id number] Arrival date & time: 09/28/24  9245     Patient presents with: Medication Reaction   Barbara Rodriguez is a 84 y.o. female.   Patient is an 84 year old female with a past medical history of hypertension, hyperlipidemia, diabetes and a pinched nerve in her neck presenting to the emergency department after accidentally taking an extra dose of her medications.  The patient states that she took a nap yesterday afternoon and when she woke up she was all the clock states that it was 6:30 PM.  She thought that it was 6:30 in the morning and took all her normal morning medications which include amlodipine, losartan , metformin and rosuvastatin.  She states that she normally takes these in the morning and only on the metformin at night.  She states that she felt okay last night but was anxious this morning so she called the pharmacy who recommended she come to the ED for evaluation.  She states that she has had no lightheadedness or dizziness.  She states that she has had pain in the left side of her neck that is related to her pinched nerve.  Denies any nausea, vomiting or abdominal pain.  She states that she did not take any of her medications this morning.  The history is provided by the patient.       Prior to Admission medications  Medication Sig Start Date End Date Taking? Authorizing Provider  ACCU-CHEK GUIDE TEST test strip USE TO TEST BLOOD SUGAR 2 TO 3 TIMES A DAY AS DIRECTED 07/28/23   [provider]  amLODipine (NORVASC) 5 MG tablet Take 5 mg by mouth daily. 02/21/24   [provider]  Blood Glucose Monitoring Suppl (ACCU-CHEK GUIDE) w/Device KIT USE TO CHECK BLOOD GLUCOSE 2-3 TIMES DAILY 07/29/23   [provider]  cholecalciferol (VITAMIN D3) 25 MCG (1000 UNIT) tablet Take 1,000 Units by mouth daily.    [provider]  ciclopirox  (PENLAC ) 8 % solution Apply  topically at bedtime. Apply over nail and surrounding skin. Apply daily over previous coat. After seven (7) days, may remove with alcohol and continue cycle. Patient not taking: Reported on 05/30/2024 05/24/23   Janit Thresa HERO, DPM  diclofenac  (VOLTAREN ) 50 MG EC tablet Take 1 tablet (50 mg total) by mouth 2 (two) times daily. 03/03/24   Zelaya, Oscar A, PA-C  ECHINACEA EXTRACT PO Take by mouth daily as needed.    [provider]  fexofenadine (ALLEGRA) 180 MG tablet Take 180 mg by mouth daily as needed for allergies or rhinitis.    [provider]  latanoprost (XALATAN) 0.005 % ophthalmic solution 1 drop at bedtime. 07/31/23   [provider]  losartan  (COZAAR ) 25 MG tablet TAKE 1 TABLET(25 MG) BY MOUTH IN THE MORNING AND AT BEDTIME 10/25/23   Walker, Caitlin S, NP  metFORMIN (GLUCOPHAGE) 500 MG tablet Take 1 tablet in the morning and 1/2 tablet in the evening    [provider]    Allergies: Align [bacid], Clindamycin/lincomycin, Erythromycin, Prednisone, Ramipril, Red yeast rice extract, Red yeast rice [monascus purpureus went yeast], Tizanidine, Vitamin b12, and Amoxicillin    Review of Systems  Updated Vital Signs BP (!) 169/64   Pulse 87   Temp 97.9 F (36.6 C) (Oral)   Resp 20   Ht 5' 2 (1.575 m)   Wt 57 kg   SpO2 99%   BMI 22.97 kg/m  Physical Exam Vitals and nursing note reviewed.  Constitutional:      General: She is not in acute distress.    Appearance: Normal appearance.  HENT:     Head: Normocephalic and atraumatic.     Nose: Nose normal.     Mouth/Throat:     Mouth: Mucous membranes are moist.     Pharynx: Oropharynx is clear.  Eyes:     Extraocular Movements: Extraocular movements intact.     Conjunctiva/sclera: Conjunctivae normal.  Cardiovascular:     Rate and Rhythm: Normal rate and regular rhythm.     Heart sounds: Normal heart sounds.  Pulmonary:     Effort: Pulmonary effort is normal.     Breath sounds: Normal breath  sounds.  Abdominal:     General: Abdomen is flat.     Palpations: Abdomen is soft.     Tenderness: There is no abdominal tenderness.  Musculoskeletal:        General: Tenderness (Left side of neck) present. Normal range of motion.     Cervical back: Normal range of motion.  Skin:    General: Skin is warm and dry.  Neurological:     General: No focal deficit present.     Mental Status: She is alert and oriented to person, place, and time.  Psychiatric:        Mood and Affect: Mood normal.        Behavior: Behavior normal.     (all labs ordered are listed, but only abnormal results are displayed) Labs Reviewed - No data to display  EKG: EKG Interpretation Date/Time:  Thursday September 28 2024 08:08:06 EST Ventricular Rate:  88 PR Interval:  151 QRS Duration:  136 QT Interval:  388 QTC Calculation: 470 R Axis:   -78  Text Interpretation: Sinus rhythm RBBB and LAFB Minimal ST elevation, lateral leads Artifact Otherwise no significant change Confirmed by Ellouise Fine ([id number]) on 09/28/2024 8:09:30 AM  Radiology: No results found.   Procedures   Medications Ordered in the ED - No data to display                                  Medical Decision Making This patient presents to the ED with chief complaint(s) of accidental medication administration with pertinent past medical history of hypertension, hyperlipidemia, diabetes, pinched nerve which further complicates the presenting complaint. The complaint involves an extensive differential diagnosis and also carries with it a high risk of complications and morbidity.    The differential diagnosis includes accidental overdose, arrhythmia, Co. ingestion, intoxication  Additional history obtained: Additional history obtained from N/A Records reviewed Primary Care Documents  ED Course and Reassessment: On patient's arrival she is mildly hypertensive and otherwise hemodynamically stable in no acute distress and is  asymptomatic.  The patient's ingestion was 14 hours ago at this point and she is asymptomatic without any hypotension or bradycardia.  Have low suspicion for any adverse effects from taking 1 extra dose of her medications last night.  The patient is stable for discharge home and was recommended outpatient follow-up was given strict return precaution.  Independent labs interpretation:  N/A  Independent visualization of imaging: -N/A  Consultation: - Consulted or discussed management/test interpretation w/ external professional: N/A  Consideration for admission or further workup: Patient has no emergent conditions requiring admission or further work-up at this time and is stable for discharge home with primary care follow-up  Social Determinants of health: N/A         Final diagnoses:  Accidental medication error, initial encounter    ED Discharge Orders     None          Kingsley, Shateka Petrea K, [place] 09/28/24 [phone]  "
